# Patient Record
Sex: Male | Born: 1945
Health system: Southern US, Community
[De-identification: ages and names within clinical notes are randomized; demographics above are authoritative.]

## PROBLEM LIST (undated history)

## (undated) DIAGNOSIS — R21 Rash and other nonspecific skin eruption: Secondary | ICD-10-CM

## (undated) DIAGNOSIS — R030 Elevated blood-pressure reading, without diagnosis of hypertension: Secondary | ICD-10-CM

## (undated) HISTORY — PX: KNEE ARTHROSCOPY: SUR90

## (undated) HISTORY — PX: TONSILLECTOMY: SUR1361

---

## 2006-01-14 ENCOUNTER — Ambulatory Visit (HOSPITAL_COMMUNITY): Admission: RE | Admit: 2006-01-14 | Discharge: 2006-01-14 | Payer: Self-pay | Admitting: Surgery

## 2012-04-02 DIAGNOSIS — Z0000 Encounter for general adult medical examination without abnormal findings: Secondary | ICD-10-CM | POA: Diagnosis not present

## 2013-11-29 ENCOUNTER — Ambulatory Visit (HOSPITAL_COMMUNITY)
Admission: RE | Admit: 2013-11-29 | Discharge: 2013-11-29 | Disposition: A | Discharge status: Home / Self Care | Payer: Medicare Other | Source: Ambulatory Visit | Attending: Family Medicine | Admitting: Family Medicine

## 2013-11-29 ENCOUNTER — Ambulatory Visit (INDEPENDENT_AMBULATORY_CARE_PROVIDER_SITE_OTHER): Payer: Medicare Other | Admitting: Family Medicine

## 2013-11-29 ENCOUNTER — Encounter (INDEPENDENT_AMBULATORY_CARE_PROVIDER_SITE_OTHER): Payer: Self-pay

## 2013-11-29 ENCOUNTER — Encounter: Payer: Self-pay | Admitting: Family Medicine

## 2013-11-29 VITALS — BP 138/86 | HR 68 | Temp 98.5°F | Ht 74.0 in | Wt 168.0 lb

## 2013-11-29 DIAGNOSIS — K4090 Unilateral inguinal hernia, without obstruction or gangrene, not specified as recurrent: Secondary | ICD-10-CM | POA: Diagnosis not present

## 2013-11-29 DIAGNOSIS — R109 Unspecified abdominal pain: Secondary | ICD-10-CM | POA: Insufficient documentation

## 2013-11-29 DIAGNOSIS — K439 Ventral hernia without obstruction or gangrene: Secondary | ICD-10-CM | POA: Insufficient documentation

## 2013-11-29 DIAGNOSIS — F1010 Alcohol abuse, uncomplicated: Secondary | ICD-10-CM

## 2013-11-29 DIAGNOSIS — T148XXA Other injury of unspecified body region, initial encounter: Secondary | ICD-10-CM

## 2013-11-29 MED ORDER — IOHEXOL 300 MG/ML  SOLN
100.0000 mL | Freq: Once | INTRAMUSCULAR | Status: AC | PRN
  Administered 2013-11-29: 100 mL via INTRAVENOUS

## 2013-11-29 NOTE — Addendum Note (Signed)
Addended by: Orma Render F on: 11/29/2013 12:40 PM   Modules accepted: Orders

## 2013-11-29 NOTE — Progress Notes (Signed)
   Subjective:    Patient ID: Anthony Wise, male    DOB: 1946/08/26, 67 y.o.   MRN: [id number]  HPI Pt presents today with chief complaint of inguinal hernia Pt states that he was seen for inguinal hernia in 2009 Was supposed to have surgery, but was held because pt had active rash.  Had rash evaluated by derm, but never followed up for surgery.  Pt states that since 2009, hernia has progressively gotten larger.  No abd pain, feverm nausea, vomiting, diarrhea.  Denies any strenuous activity.   Pt also reports easy bruising over last year.  No overt bleeding Seems to bruise with slightest trauma.  No recent infections.  Pt does report drinking at least 6-8 beers per day.  No aspirin or anticoagulant use.  No NSAIDs.      Review of Systems  All other systems reviewed and are negative.       Objective:   Physical Exam  Constitutional: He appears well-developed and well-nourished.  HENT:  Head: Normocephalic and atraumatic.  Eyes: Conjunctivae are normal. Pupils are equal, round, and reactive to light.  Neck: Normal range of motion. Neck supple.  Cardiovascular: Normal rate and regular rhythm.   Pulmonary/Chest: Effort normal and breath sounds normal.  Abdominal: Soft.  Genitourinary:     Marked L inguinal hernia extending in L groin. Volume approx [phone] ccs Non tender    Neurological: He is alert.  Skin: Skin is warm.  Noted faint ecchymoses on dorsum of hands            Assessment & Plan:  Inguinal hernia - Plan: Comprehensive metabolic panel, CT Abdomen Pelvis W Contrast, CANCELED: CT Abdomen Pelvis W Contrast  Bruising - Plan: POCT CBC, POCT INR  ETOH abuse - Plan: Ammonia  Will send pt for CT scan to assess extent of hernia with pendin surgery follow up.  Also check baseline Cr and K Bruising:  Broad DDx, though higher concern for coagulopathy 2/2 liver disease in setting of longstanding ETOH.  Check INR, CBC.  Also check ammonia level.  If  thrombocytopenia present, check DIC panel.  Consider heme follow up.  Avoid NSAIDs, ASA.  Avoid ETOH Follow up pending bloodwork.

## 2013-11-30 LAB — CBC WITH DIFFERENTIAL
Basophils Absolute: 0.1 10*3/uL (ref 0.0–0.2)
Eosinophils Absolute: 0.3 10*3/uL (ref 0.0–0.4)
Immature Grans (Abs): 0 10*3/uL (ref 0.0–0.1)
Immature Granulocytes: 0 %
MCH: 32.8 pg (ref 26.6–33.0)
MCHC: 34.8 g/dL (ref 31.5–35.7)
Monocytes Absolute: 0.6 10*3/uL (ref 0.1–0.9)
RBC: 4.6 x10E6/uL (ref 4.14–5.80)
RDW: 13.2 % (ref 12.3–15.4)
WBC: 5.1 10*3/uL (ref 3.4–10.8)

## 2013-11-30 LAB — COMPREHENSIVE METABOLIC PANEL
Albumin/Globulin Ratio: 1.8 (ref 1.1–2.5)
Albumin: 4.4 g/dL (ref 3.6–4.8)
BUN/Creatinine Ratio: 15 (ref 10–22)
BUN: 8 mg/dL (ref 8–27)
CO2: 25 mmol/L (ref 18–29)
Calcium: 9.4 mg/dL (ref 8.6–10.2)
Potassium: 5 mmol/L (ref 3.5–5.2)
Sodium: 132 mmol/L — ABNORMAL LOW (ref 134–144)

## 2013-11-30 LAB — POCT I-STAT CREATININE: Creatinine, Ser: 0.7 mg/dL (ref 0.50–1.35)

## 2013-12-01 ENCOUNTER — Telehealth: Payer: Self-pay | Admitting: *Deleted

## 2013-12-01 NOTE — Telephone Encounter (Signed)
Left message for pt to call back. Labs normal

## 2013-12-01 NOTE — Telephone Encounter (Signed)
Pt notified of results Verbalizes understanding 

## 2014-01-14 ENCOUNTER — Encounter (INDEPENDENT_AMBULATORY_CARE_PROVIDER_SITE_OTHER): Payer: Self-pay | Admitting: Surgery

## 2014-01-14 ENCOUNTER — Ambulatory Visit (INDEPENDENT_AMBULATORY_CARE_PROVIDER_SITE_OTHER): Payer: Medicare Other | Admitting: Surgery

## 2014-01-14 VITALS — BP 150/98 | HR 76 | Temp 97.9°F | Resp 14 | Ht 75.0 in | Wt 170.0 lb

## 2014-01-14 DIAGNOSIS — K4090 Unilateral inguinal hernia, without obstruction or gangrene, not specified as recurrent: Secondary | ICD-10-CM

## 2014-01-14 NOTE — Progress Notes (Signed)
Re:   Anthony Wise. DOB:   May 12, 1946 MRN:   [id number]  ASSESSMENT AND PLAN: 1.  Giant incarcerated left inguinal hernia  I discussed the indications and complications of hernia surgery with the patient.  I discussed both the laparoscopic and open approach to hernia repair..  The potential risks of hernia surgery include, but are not limited to, bleeding, infection, open surgery, nerve injury, and recurrence of the hernia.  I provided the patient literature about hernia surgery.  Because of the size of the hernia, he is not a candidate for laparoscopic repair and needs an open repair.  I also discussed with him possible laparotomy/laparoscopy to reduce the hernia, if I could not manage it by an inguinal incision.  2.  Smokes - has cut back to 1/2 ppd 3.  Back issues.  Has seen a chiropractor remotely.  Chief Complaint  Patient presents with  . New Evaluation    eval [facility]   REFERRING PHYSICIAN: Redge Gainer, MD  HISTORY OF PRESENT ILLNESS: Anthony Blyth. is a 68 y.o. (DOB: March 28, 1946)  White  male whose primary care physician is Dr. Ernestina Wise (with  Redge Gainer, MD) and comes to me today for a left inguinal hernia. He comes with his wife. He has had a left inguinal hernia since at least 2009.  It has gotten steadily larger, but he decided to do nothing about it.  But recently he said that is has gotten "out of control".  He saw Dr. Ernestina Wise, who ordered a CT scan and referred the patient for further evaluation.  CT scan - 11/29/2014 - 1. There is a large left inguinal scrotal ventral hernia containing fat small bowel and large bowel without evidence of acute obstruction or incarceration. Mild stranding of the fat surrounding central mesenteric vessels suspicious for mild edema. The hernia is only partially visualized measures at least 7.5 by 10 cm  2. Nonspecific mild thickening of urinary bladder wall.  3. Extensive atherosclerotic vascular calcifications.  4. No hydronephrosis  or hydroureter.  5. No pericecal inflammation.    History reviewed. No pertinent past medical history.    Past Surgical History  Procedure Laterality Date  . Knee arthroscopy        No current outpatient prescriptions on file.   No current facility-administered medications for this visit.     No Known Allergies  REVIEW OF SYSTEMS: Skin:  No history of rash.  No history of abnormal moles. Infection:  No history of hepatitis or HIV.  No history of MRSA. Neurologic:  No history of stroke.  No history of seizure.  No history of headaches. Cardiac:  No history of hypertension. No history of heart disease.  No history of prior cardiac catheterization.  No history of seeing a cardiologist. Pulmonary: Smokes 1/2 ppd.  He said that he has cut down from about 2 ppd.  Endocrine:  No diabetes. No thyroid disease. Gastrointestinal:  No history of stomach disease.  No history of liver disease.  No history of gall bladder disease.  No history of pancreas disease.  No history of colon disease. Urologic:  No history of kidney stones.  No history of bladder infections. Musculoskeletal:  Has had some back pain, seen by a chiropractor in the past.  Had Baker's cyst removed from right knee about 1990 Hematologic:  No bleeding disorder.  No history of anemia.  Not anticoagulated. Psycho-social:  The patient is oriented.   The patient has no obvious psychologic or social  impairment to understanding our conversation and plan.  SOCIAL and FAMILY HISTORY: Married - wife Anthony Wise with him. 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  PHYSICAL EXAM: BP 150/98  Pulse 76  Temp(Src) 97.9 F (36.6 C) (Temporal)  Resp 14  Ht 6\' 3"  (1.905 m)  Wt 170 lb (77.111 kg)  BMI 21.25 kg/m2  General: Thin WN WM who is alert and generally healthy appearing.  HEENT: Normal. Pupils equal. Neck: Supple. No mass.  No thyroid mass. Lymph Nodes:  No supraclavicular or cervical nodes. Lungs: Clear  to auscultation and symmetric breath sounds. Heart:  RRR. No murmur or rub. Abdomen: Soft. No mass. No tenderness.Normal bowel sounds.  He has a very large, non reducible left inguinal hernia.  It is non tender and chronically incarcerated. Rectal: Not done. Extremities:  Good strength and ROM  in upper and lower extremities. Neurologic:  Grossly intact to motor and sensory function. Psychiatric: Has normal mood and affect. Behavior is normal.   DATA REVIEWED: Notes in [place], MD,  [facility], [place] [address] [place]  [place], [place]    [place] Phone:  [place]:  [phone]

## 2014-01-19 ENCOUNTER — Encounter (HOSPITAL_COMMUNITY): Payer: Self-pay | Admitting: Pharmacy Technician

## 2014-01-20 ENCOUNTER — Encounter (HOSPITAL_COMMUNITY): Payer: Self-pay

## 2014-01-20 ENCOUNTER — Ambulatory Visit (HOSPITAL_COMMUNITY)
Admission: RE | Admit: 2014-01-20 | Discharge: 2014-01-20 | Disposition: A | Discharge status: Home / Self Care | Payer: Medicare Other | Source: Ambulatory Visit | Attending: Surgery | Admitting: Surgery

## 2014-01-20 ENCOUNTER — Encounter (HOSPITAL_COMMUNITY)
Admission: RE | Admit: 2014-01-20 | Discharge: 2014-01-20 | Disposition: A | Discharge status: Home / Self Care | Payer: Medicare Other | Source: Ambulatory Visit | Attending: Surgery | Admitting: Surgery

## 2014-01-20 DIAGNOSIS — Z01810 Encounter for preprocedural cardiovascular examination: Secondary | ICD-10-CM

## 2014-01-20 DIAGNOSIS — Z01818 Encounter for other preprocedural examination: Secondary | ICD-10-CM

## 2014-01-20 DIAGNOSIS — Z01812 Encounter for preprocedural laboratory examination: Secondary | ICD-10-CM

## 2014-01-20 DIAGNOSIS — Z23 Encounter for immunization: Secondary | ICD-10-CM | POA: Diagnosis not present

## 2014-01-20 DIAGNOSIS — IMO0001 Reserved for inherently not codable concepts without codable children: Secondary | ICD-10-CM

## 2014-01-20 DIAGNOSIS — K4030 Unilateral inguinal hernia, with obstruction, without gangrene, not specified as recurrent: Secondary | ICD-10-CM | POA: Diagnosis not present

## 2014-01-20 DIAGNOSIS — K4090 Unilateral inguinal hernia, without obstruction or gangrene, not specified as recurrent: Secondary | ICD-10-CM | POA: Insufficient documentation

## 2014-01-20 DIAGNOSIS — F17200 Nicotine dependence, unspecified, uncomplicated: Secondary | ICD-10-CM | POA: Diagnosis not present

## 2014-01-20 DIAGNOSIS — I7090 Unspecified atherosclerosis: Secondary | ICD-10-CM | POA: Diagnosis not present

## 2014-01-20 HISTORY — DX: Reserved for inherently not codable concepts without codable children: IMO0001

## 2014-01-20 HISTORY — DX: Elevated blood-pressure reading, without diagnosis of hypertension: R03.0

## 2014-01-20 LAB — CBC
HEMATOCRIT: 41.4 % (ref 39.0–52.0)
Hemoglobin: 14.5 g/dL (ref 13.0–17.0)
MCH: 31.7 pg (ref 26.0–34.0)
MCHC: 35 g/dL (ref 30.0–36.0)
MCV: 90.6 fL (ref 78.0–100.0)
Platelets: 230 10*3/uL (ref 150–400)
RBC: 4.57 MIL/uL (ref 4.22–5.81)
RDW: 12.9 % (ref 11.5–15.5)
WBC: 5.5 10*3/uL (ref 4.0–10.5)

## 2014-01-20 LAB — BASIC METABOLIC PANEL
BUN: 8 mg/dL (ref 6–23)
CALCIUM: 9.1 mg/dL (ref 8.4–10.5)
CO2: 24 meq/L (ref 19–32)
Chloride: 96 mEq/L (ref 96–112)
Creatinine, Ser: 0.5 mg/dL (ref 0.50–1.35)
GFR calc Af Amer: >90 mL/min (ref >90)
GLUCOSE: 94 mg/dL (ref 70–99)
Potassium: 4.2 mEq/L (ref 3.7–5.3)
SODIUM: 131 meq/L — AB (ref 137–147)

## 2014-01-20 NOTE — Pre-Procedure Instructions (Signed)
3:45 PM- CHART SENT TO SHORT STAY CENTER - CXR REPORT IS PENDING

## 2014-01-20 NOTE — Patient Instructions (Signed)
YOUR SURGERY IS SCHEDULED AT [facility]  ON:  Friday  1/23  REPORT TO  SHORT STAY CENTER AT:  8:00 AM      PHONE # FOR SHORT STAY IS [phone]  DO NOT EAT OR DRINK ANYTHING AFTER MIDNIGHT THE NIGHT BEFORE YOUR SURGERY.  YOU MAY BRUSH YOUR TEETH, RINSE OUT YOUR MOUTH--BUT NO WATER, NO FOOD, NO CHEWING GUM, NO MINTS, NO CANDIES, NO CHEWING TOBACCO.  PLEASE TAKE THE FOLLOWING MEDICATIONS THE AM OF YOUR SURGERY WITH A FEW SIPS OF WATER:  NO MEDS TO TAKE   DO NOT BRING VALUABLES, MONEY, CREDIT CARDS.  DO NOT WEAR JEWELRY, MAKE-UP, NAIL POLISH AND NO METAL PINS OR CLIPS IN YOUR HAIR. CONTACT LENS, DENTURES / PARTIALS, GLASSES SHOULD NOT BE WORN TO SURGERY AND IN MOST CASES-HEARING AIDS WILL NEED TO BE REMOVED.  BRING YOUR GLASSES CASE, ANY EQUIPMENT NEEDED FOR YOUR CONTACT LENS. FOR PATIENTS ADMITTED TO THE HOSPITAL--CHECK OUT TIME THE DAY OF DISCHARGE IS 11:00 AM.  ALL INPATIENT ROOMS ARE PRIVATE - WITH BATHROOM, TELEPHONE, TELEVISION AND WIFI INTERNET.                                                    FAILURE TO FOLLOW THESE INSTRUCTIONS MAY RESULT IN THE CANCELLATION OF YOUR SURGERY. PLEASE BE AWARE THAT YOU MAY NEED ADDITIONAL BLOOD DRAWN DAY OF YOUR SURGERY  PATIENT SIGNATURE_________________________________

## 2014-01-20 NOTE — Pre-Procedure Instructions (Signed)
EKG AND CXR WERE DONE TODAY - PREOP AT [facility]. 

## 2014-01-21 ENCOUNTER — Encounter (HOSPITAL_COMMUNITY)
Admission: RE | Disposition: A | Discharge status: Home / Self Care | Payer: Self-pay | Source: Ambulatory Visit | Attending: Surgery

## 2014-01-21 ENCOUNTER — Encounter (HOSPITAL_COMMUNITY): Payer: Medicare Other | Admitting: *Deleted

## 2014-01-21 ENCOUNTER — Encounter (HOSPITAL_COMMUNITY): Payer: Self-pay | Admitting: *Deleted

## 2014-01-21 ENCOUNTER — Observation Stay (HOSPITAL_COMMUNITY)
Admission: RE | Admit: 2014-01-21 | Discharge: 2014-01-22 | Disposition: A | Discharge status: Home / Self Care | Payer: Medicare Other | Source: Ambulatory Visit | Attending: Surgery | Admitting: Surgery

## 2014-01-21 ENCOUNTER — Ambulatory Visit (HOSPITAL_COMMUNITY): Payer: Medicare Other | Admitting: *Deleted

## 2014-01-21 DIAGNOSIS — F17200 Nicotine dependence, unspecified, uncomplicated: Secondary | ICD-10-CM | POA: Diagnosis not present

## 2014-01-21 DIAGNOSIS — Z23 Encounter for immunization: Secondary | ICD-10-CM | POA: Insufficient documentation

## 2014-01-21 DIAGNOSIS — I7090 Unspecified atherosclerosis: Secondary | ICD-10-CM | POA: Insufficient documentation

## 2014-01-21 DIAGNOSIS — K4030 Unilateral inguinal hernia, with obstruction, without gangrene, not specified as recurrent: Secondary | ICD-10-CM

## 2014-01-21 DIAGNOSIS — K4090 Unilateral inguinal hernia, without obstruction or gangrene, not specified as recurrent: Secondary | ICD-10-CM | POA: Diagnosis not present

## 2014-01-21 HISTORY — PX: INGUINAL HERNIA REPAIR: SHX194

## 2014-01-21 SURGERY — REPAIR, HERNIA, INGUINAL, ADULT
Anesthesia: General | Site: Abdomen | Laterality: Left

## 2014-01-21 MED ORDER — BUPIVACAINE HCL (PF) 0.25 % IJ SOLN: INTRAMUSCULAR | Status: DC | PRN

## 2014-01-21 MED ORDER — ROCURONIUM BROMIDE 100 MG/10ML IV SOLN
INTRAVENOUS | Status: DC | PRN
  Administered 2014-01-21: 10 mg via INTRAVENOUS
  Administered 2014-01-21: 40 mg via INTRAVENOUS

## 2014-01-21 MED ORDER — CEFAZOLIN SODIUM-DEXTROSE 2-3 GM-% IV SOLR
INTRAVENOUS | Status: AC
  Filled 2014-01-21: qty 50

## 2014-01-21 MED ORDER — INFLUENZA VAC SPLIT QUAD 0.5 ML IM SUSP
0.5000 mL | INTRAMUSCULAR | Status: AC
  Administered 2014-01-22: 0.5 mL via INTRAMUSCULAR
  Filled 2014-01-21 (×2): qty 0.5

## 2014-01-21 MED ORDER — MIDAZOLAM HCL 5 MG/5ML IJ SOLN
INTRAMUSCULAR | Status: DC | PRN
  Administered 2014-01-21 (×2): 1 mg via INTRAVENOUS

## 2014-01-21 MED ORDER — HYDROCODONE-ACETAMINOPHEN 5-325 MG PO TABS: 1.0000 | ORAL_TABLET | ORAL | Status: DC | PRN

## 2014-01-21 MED ORDER — CEFAZOLIN SODIUM-DEXTROSE 2-3 GM-% IV SOLR
2.0000 g | INTRAVENOUS | Status: AC
  Administered 2014-01-21: 2 g via INTRAVENOUS

## 2014-01-21 MED ORDER — CHLORHEXIDINE GLUCONATE 4 % EX LIQD: 1.0000 "application " | Freq: Once | CUTANEOUS | Status: DC

## 2014-01-21 MED ORDER — MORPHINE SULFATE 2 MG/ML IJ SOLN: 1.0000 mg | INTRAMUSCULAR | Status: DC | PRN

## 2014-01-21 MED ORDER — FENTANYL CITRATE 0.05 MG/ML IJ SOLN
INTRAMUSCULAR | Status: AC
  Filled 2014-01-21: qty 5

## 2014-01-21 MED ORDER — PROPOFOL 10 MG/ML IV BOLUS
INTRAVENOUS | Status: AC
  Filled 2014-01-21: qty 20

## 2014-01-21 MED ORDER — HYDROMORPHONE HCL PF 1 MG/ML IJ SOLN: 0.2500 mg | INTRAMUSCULAR | Status: DC | PRN

## 2014-01-21 MED ORDER — MIDAZOLAM HCL 2 MG/2ML IJ SOLN
INTRAMUSCULAR | Status: AC
  Filled 2014-01-21: qty 2

## 2014-01-21 MED ORDER — LACTATED RINGERS IV SOLN: INTRAVENOUS | Status: DC

## 2014-01-21 MED ORDER — NEOSTIGMINE METHYLSULFATE 1 MG/ML IJ SOLN
INTRAMUSCULAR | Status: DC | PRN
  Administered 2014-01-21 (×4): 1 mg via INTRAVENOUS

## 2014-01-21 MED ORDER — DEXAMETHASONE SODIUM PHOSPHATE 10 MG/ML IJ SOLN
INTRAMUSCULAR | Status: AC
  Filled 2014-01-21: qty 1

## 2014-01-21 MED ORDER — LACTATED RINGERS IV SOLN: INTRAVENOUS | Status: DC | PRN

## 2014-01-21 MED ORDER — BUPIVACAINE HCL (PF) 0.25 % IJ SOLN
INTRAMUSCULAR | Status: AC
  Filled 2014-01-21: qty 30

## 2014-01-21 MED ORDER — NEOSTIGMINE METHYLSULFATE 1 MG/ML IJ SOLN
INTRAMUSCULAR | Status: AC
  Filled 2014-01-21: qty 10

## 2014-01-21 MED ORDER — PROPOFOL 10 MG/ML IV BOLUS
INTRAVENOUS | Status: DC | PRN
  Administered 2014-01-21: 50 mg via INTRAVENOUS
  Administered 2014-01-21: 150 mg via INTRAVENOUS

## 2014-01-21 MED ORDER — FENTANYL CITRATE 0.05 MG/ML IJ SOLN
INTRAMUSCULAR | Status: DC | PRN
  Administered 2014-01-21: 100 ug via INTRAVENOUS
  Administered 2014-01-21 (×2): 50 ug via INTRAVENOUS

## 2014-01-21 MED ORDER — HEPARIN SODIUM (PORCINE) 5000 UNIT/ML IJ SOLN
5000.0000 [IU] | Freq: Three times a day (TID) | INTRAMUSCULAR | Status: DC
Start: 2014-01-21 — End: 2014-01-22
  Administered 2014-01-21 – 2014-01-22 (×2): 5000 [IU] via SUBCUTANEOUS
  Filled 2014-01-21 (×5): qty 1

## 2014-01-21 MED ORDER — PNEUMOCOCCAL VAC POLYVALENT 25 MCG/0.5ML IJ INJ
0.5000 mL | INJECTION | INTRAMUSCULAR | Status: AC
  Administered 2014-01-22: 0.5 mL via INTRAMUSCULAR
  Filled 2014-01-21 (×2): qty 0.5

## 2014-01-21 MED ORDER — KETAMINE HCL 10 MG/ML IJ SOLN
INTRAMUSCULAR | Status: DC | PRN
  Administered 2014-01-21: 20 mg via INTRAVENOUS
  Administered 2014-01-21: 10 mg via INTRAVENOUS
  Administered 2014-01-21: 20 mg via INTRAVENOUS

## 2014-01-21 MED ORDER — IBUPROFEN 600 MG PO TABS
600.0000 mg | ORAL_TABLET | Freq: Four times a day (QID) | ORAL | Status: DC | PRN
  Filled 2014-01-21: qty 1

## 2014-01-21 MED ORDER — ONDANSETRON HCL 4 MG/2ML IJ SOLN
INTRAMUSCULAR | Status: DC | PRN
  Administered 2014-01-21: 4 mg via INTRAVENOUS

## 2014-01-21 MED ORDER — EPHEDRINE SULFATE 50 MG/ML IJ SOLN
INTRAMUSCULAR | Status: DC | PRN
  Administered 2014-01-21 (×2): 10 mg via INTRAVENOUS
  Administered 2014-01-21: 5 mg via INTRAVENOUS
  Administered 2014-01-21 (×2): 10 mg via INTRAVENOUS

## 2014-01-21 MED ORDER — METOCLOPRAMIDE HCL 5 MG/ML IJ SOLN
INTRAMUSCULAR | Status: AC
  Filled 2014-01-21: qty 2

## 2014-01-21 MED ORDER — POTASSIUM CHLORIDE IN NACL 20-0.45 MEQ/L-% IV SOLN
INTRAVENOUS | Status: DC
  Administered 2014-01-21: 15:00:00 via INTRAVENOUS
  Filled 2014-01-21 (×2): qty 1000

## 2014-01-21 MED ORDER — 0.9 % SODIUM CHLORIDE (POUR BTL) OPTIME
TOPICAL | Status: DC | PRN
  Administered 2014-01-21: 1000 mL

## 2014-01-21 MED ORDER — PHENYLEPHRINE 40 MCG/ML (10ML) SYRINGE FOR IV PUSH (FOR BLOOD PRESSURE SUPPORT)
PREFILLED_SYRINGE | INTRAVENOUS | Status: AC
  Filled 2014-01-21: qty 10

## 2014-01-21 MED ORDER — PHENYLEPHRINE HCL 10 MG/ML IJ SOLN
INTRAMUSCULAR | Status: AC
  Filled 2014-01-21: qty 1

## 2014-01-21 MED ORDER — GLYCOPYRROLATE 0.2 MG/ML IJ SOLN
INTRAMUSCULAR | Status: AC
  Filled 2014-01-21: qty 3

## 2014-01-21 MED ORDER — EPHEDRINE SULFATE 50 MG/ML IJ SOLN
INTRAMUSCULAR | Status: AC
  Filled 2014-01-21: qty 1

## 2014-01-21 MED ORDER — SODIUM CHLORIDE 0.9 % IJ SOLN
INTRAMUSCULAR | Status: AC
  Filled 2014-01-21: qty 50

## 2014-01-21 MED ORDER — SODIUM CHLORIDE 0.9 % IJ SOLN
INTRAMUSCULAR | Status: DC | PRN
  Administered 2014-01-21: 12:00:00

## 2014-01-21 MED ORDER — KETAMINE HCL 10 MG/ML IJ SOLN
INTRAMUSCULAR | Status: AC
  Filled 2014-01-21: qty 1

## 2014-01-21 MED ORDER — LACTATED RINGERS IV SOLN
INTRAVENOUS | Status: DC | PRN
  Administered 2014-01-21 (×3): via INTRAVENOUS

## 2014-01-21 MED ORDER — DEXAMETHASONE SODIUM PHOSPHATE 4 MG/ML IJ SOLN
INTRAMUSCULAR | Status: DC | PRN
  Administered 2014-01-21: 10 mg via INTRAVENOUS

## 2014-01-21 MED ORDER — PROMETHAZINE HCL 25 MG/ML IJ SOLN: 6.2500 mg | INTRAMUSCULAR | Status: DC | PRN

## 2014-01-21 MED ORDER — PHENYLEPHRINE HCL 10 MG/ML IJ SOLN
INTRAMUSCULAR | Status: DC | PRN
  Administered 2014-01-21: 40 ug via INTRAVENOUS
  Administered 2014-01-21: 80 ug via INTRAVENOUS
  Administered 2014-01-21: 120 ug via INTRAVENOUS
  Administered 2014-01-21: 40 ug via INTRAVENOUS
  Administered 2014-01-21: 120 ug via INTRAVENOUS

## 2014-01-21 MED ORDER — BUPIVACAINE LIPOSOME 1.3 % IJ SUSP
20.0000 mL | Freq: Once | INTRAMUSCULAR | Status: DC
  Filled 2014-01-21: qty 20

## 2014-01-21 MED ORDER — ONDANSETRON HCL 4 MG/2ML IJ SOLN
INTRAMUSCULAR | Status: AC
  Filled 2014-01-21: qty 2

## 2014-01-21 MED ORDER — ROCURONIUM BROMIDE 100 MG/10ML IV SOLN
INTRAVENOUS | Status: AC
  Filled 2014-01-21: qty 1

## 2014-01-21 MED ORDER — GLYCOPYRROLATE 0.2 MG/ML IJ SOLN
INTRAMUSCULAR | Status: DC | PRN
  Administered 2014-01-21 (×4): 0.1 mg via INTRAVENOUS

## 2014-01-21 MED ORDER — METOCLOPRAMIDE HCL 5 MG/ML IJ SOLN
INTRAMUSCULAR | Status: DC | PRN
  Administered 2014-01-21: 10 mg via INTRAVENOUS

## 2014-01-21 SURGICAL SUPPLY — 46 items
ADH SKN CLS APL DERMABOND .7 (GAUZE/BANDAGES/DRESSINGS) ×1
APL SKNCLS STERI-STRIP NONHPOA (GAUZE/BANDAGES/DRESSINGS)
BENZOIN TINCTURE PRP APPL 2/3 (GAUZE/BANDAGES/DRESSINGS) ×1 IMPLANT
BLADE HEX COATED 2.75 (ELECTRODE) ×3 IMPLANT
BLADE SURG 15 STRL LF DISP TIS (BLADE) IMPLANT
BLADE SURG 15 STRL SS (BLADE) ×3
BLADE SURG SZ10 CARB STEEL (BLADE) ×4 IMPLANT
CANISTER SUCTION 2500CC (MISCELLANEOUS) ×1 IMPLANT
CHLORAPREP W/TINT 26ML (MISCELLANEOUS) ×2 IMPLANT
CLOSURE WOUND 1/2 X4 (GAUZE/BANDAGES/DRESSINGS)
DECANTER SPIKE VIAL GLASS SM (MISCELLANEOUS) ×3 IMPLANT
DERMABOND ADVANCED (GAUZE/BANDAGES/DRESSINGS) ×2
DERMABOND ADVANCED .7 DNX12 (GAUZE/BANDAGES/DRESSINGS) IMPLANT
DISSECTOR ROUND CHERRY 3/8 STR (MISCELLANEOUS) ×2 IMPLANT
DRAIN PENROSE 18X1/2 LTX STRL (DRAIN) ×3 IMPLANT
DRAPE LAPAROTOMY TRNSV 102X78 (DRAPE) ×3 IMPLANT
ELECT REM PT RETURN 9FT ADLT (ELECTROSURGICAL) ×3
ELECTRODE REM PT RTRN 9FT ADLT (ELECTROSURGICAL) ×1 IMPLANT
GLOVE BIOGEL PI IND STRL 7.0 (GLOVE) ×1 IMPLANT
GLOVE BIOGEL PI INDICATOR 7.0 (GLOVE) ×4
GLOVE SURG SIGNA 7.5 PF LTX (GLOVE) ×5 IMPLANT
GOWN STRL REUS W/TWL LRG LVL3 (GOWN DISPOSABLE) ×3 IMPLANT
GOWN STRL REUS W/TWL XL LVL3 (GOWN DISPOSABLE) ×8 IMPLANT
KIT BASIN OR (CUSTOM PROCEDURE TRAY) ×3 IMPLANT
MESH ULTRAPRO 3X6 7.6X15CM (Mesh General) ×2 IMPLANT
NDL HYPO 25X1 1.5 SAFETY (NEEDLE) ×1 IMPLANT
NEEDLE HYPO 25X1 1.5 SAFETY (NEEDLE) ×3 IMPLANT
NS IRRIG 1000ML POUR BTL (IV SOLUTION) ×3 IMPLANT
PACK BASIC VI WITH GOWN DISP (CUSTOM PROCEDURE TRAY) ×3 IMPLANT
PENCIL BUTTON HOLSTER BLD 10FT (ELECTRODE) ×3 IMPLANT
SPONGE GAUZE 4X4 12PLY (GAUZE/BANDAGES/DRESSINGS) ×3 IMPLANT
SPONGE LAP 18X18 X RAY DECT (DISPOSABLE) ×5 IMPLANT
SPONGE LAP 4X18 X RAY DECT (DISPOSABLE) IMPLANT
STRIP CLOSURE SKIN 1/2X4 (GAUZE/BANDAGES/DRESSINGS) ×1 IMPLANT
SUT CHROMIC 0 SH (SUTURE) IMPLANT
SUT MON AB 5-0 PS2 18 (SUTURE) ×2 IMPLANT
SUT NOVA 0 T19/GS 22DT (SUTURE) ×8 IMPLANT
SUT VIC AB 0 CT2 27 (SUTURE) ×3 IMPLANT
SUT VIC AB 2-0 SH 27 (SUTURE) ×3
SUT VIC AB 2-0 SH 27X BRD (SUTURE) ×1 IMPLANT
SUT VIC AB 3-0 SH 18 (SUTURE) ×3 IMPLANT
SYR BULB IRRIGATION 50ML (SYRINGE) ×3 IMPLANT
SYR CONTROL 10ML LL (SYRINGE) ×5 IMPLANT
TOWEL OR 17X26 10 PK STRL BLUE (TOWEL DISPOSABLE) ×3 IMPLANT
TOWEL OR NON WOVEN STRL DISP B (DISPOSABLE) ×2 IMPLANT
YANKAUER SUCT BULB TIP 10FT TU (MISCELLANEOUS) ×3 IMPLANT

## 2014-01-21 NOTE — Discharge Instructions (Signed)
[facility] - DISCHARGE INSTRUCTIONS TO PATIENT  Activity:  Driving - May drive in 2 or 3 days, if doing well.   Lifting - No lifting > 15 pounds for 1 month.  Wound Care:   May shower starting Sunday, 1/25.  Diet:  As tolerated  Follow up appointment:  Call Dr. Pollie Friar office [facility]) at [phone] for an appointment in 2 to 3 weeks.  Medications and dosages:  Resume your home medications.  You have a prescription for:  Vicodin (I gave the prescription to your wife of Friday)  Call Dr. Lucia Gaskins or his office  [phone]) if you have:  Temperature greater than 100.4,  Persistent nausea and vomiting,  Severe uncontrolled pain,  Redness, tenderness, or signs of infection (pain, swelling, redness, odor or green/yellow discharge around the site),  Difficulty breathing, headache or visual disturbances,  Any other questions or concerns you may have after discharge.  In an emergency, call 911 or go to an Emergency Department at a nearby hospital.

## 2014-01-21 NOTE — Preoperative (Signed)
Beta Blockers   Reason not to administer Beta Blockers:Not Applicable 

## 2014-01-21 NOTE — Transfer of Care (Signed)
Immediate Anesthesia Transfer of Care Note  Patient: Anthony Wise.  Procedure(s) Performed: Procedure(s): OPEN LEFT INGUNIAL HERNIA REPAIR (Left)  Patient Location: PACU  Anesthesia Type:General  Level of Consciousness: Patient easily awoken, sedated, comfortable, cooperative, following commands, responds to stimulation.   Airway & Oxygen Therapy: Patient spontaneously breathing, ventilating well, oxygen via simple oxygen mask.  Post-op Assessment: Report given to PACU RN, vital signs reviewed and stable, moving all extremities.   Post vital signs: Reviewed and stable.  Complications: No apparent anesthesia complications

## 2014-01-21 NOTE — H&P (View-Only) (Signed)
 Re:   Anthony M Broda Jr. DOB:   09/21/1946 MRN:   [id number]  ASSESSMENT AND PLAN: 1.  Giant incarcerated left inguinal hernia  I discussed the indications and complications of hernia surgery with the patient.  I discussed both the laparoscopic and open approach to hernia repair..  The potential risks of hernia surgery include, but are not limited to, bleeding, infection, open surgery, nerve injury, and recurrence of the hernia.  I provided the patient literature about hernia surgery.  Because of the size of the hernia, he is not a candidate for laparoscopic repair and needs an open repair.  I also discussed with him possible laparotomy/laparoscopy to reduce the hernia, if I could not manage it by an inguinal incision.  2.  Smokes - has cut back to 1/2 ppd 3.  Back issues.  Has seen a chiropractor remotely.  Chief Complaint  Patient presents with  . New Evaluation    eval [facility]   REFERRING PHYSICIAN: MOORE, DONALD, MD  HISTORY OF PRESENT ILLNESS: Anthony M Anthony Jr. is a 67 y.o. (DOB: 04/04/1946)  White  male whose primary care physician is Dr. Newton (with  MOORE, DONALD, MD) and comes to me today for a left inguinal hernia. He comes with his wife. He has had a left inguinal hernia since at least 2009.  It has gotten steadily larger, but he decided to do nothing about it.  But recently he said that is has gotten "out of control".  He saw Dr. Newton, who ordered a CT scan and referred the patient for further evaluation.  CT scan - 11/29/2014 - 1. There is a large left inguinal scrotal ventral hernia containing fat small bowel and large bowel without evidence of acute obstruction or incarceration. Mild stranding of the fat surrounding central mesenteric vessels suspicious for mild edema. The hernia is only partially visualized measures at least 7.5 by 10 cm  2. Nonspecific mild thickening of urinary bladder wall.  3. Extensive atherosclerotic vascular calcifications.  4. No hydronephrosis  or hydroureter.  5. No pericecal inflammation.    History reviewed. No pertinent past medical history.    Past Surgical History  Procedure Laterality Date  . Knee arthroscopy        No current outpatient prescriptions on file.   No current facility-administered medications for this visit.     No Known Allergies  REVIEW OF SYSTEMS: Skin:  No history of rash.  No history of abnormal moles. Infection:  No history of hepatitis or HIV.  No history of MRSA. Neurologic:  No history of stroke.  No history of seizure.  No history of headaches. Cardiac:  No history of hypertension. No history of heart disease.  No history of prior cardiac catheterization.  No history of seeing a cardiologist. Pulmonary: Smokes 1/2 ppd.  He said that he has cut down from about 2 ppd.  Endocrine:  No diabetes. No thyroid disease. Gastrointestinal:  No history of stomach disease.  No history of liver disease.  No history of gall bladder disease.  No history of pancreas disease.  No history of colon disease. Urologic:  No history of kidney stones.  No history of bladder infections. Musculoskeletal:  Has had some back pain, seen by a chiropractor in the past.  Had Baker's cyst removed from right knee about 1990 Hematologic:  No bleeding disorder.  No history of anemia.  Not anticoagulated. Psycho-social:  The patient is oriented.   The patient has no obvious psychologic or social   impairment to understanding our conversation and plan.  SOCIAL and FAMILY HISTORY: Married - wife Dorian Pod with him. 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  PHYSICAL EXAM: BP 150/98  Pulse 76  Temp(Src) 97.9 F (36.6 C) (Temporal)  Resp 14  Ht 6\' 3"  (1.905 m)  Wt 170 lb (77.111 kg)  BMI 21.25 kg/m2  General: Thin WN WM who is alert and generally healthy appearing.  HEENT: Normal. Pupils equal. Neck: Supple. No mass.  No thyroid mass. Lymph Nodes:  No supraclavicular or cervical nodes. Lungs: Clear  to auscultation and symmetric breath sounds. Heart:  RRR. No murmur or rub. Abdomen: Soft. No mass. No tenderness.Normal bowel sounds.  He has a very large, non reducible left inguinal hernia.  It is non tender and chronically incarcerated. Rectal: Not done. Extremities:  Good strength and ROM  in upper and lower extremities. Neurologic:  Grossly intact to motor and sensory function. Psychiatric: Has normal mood and affect. Behavior is normal.   DATA REVIEWED: Notes in [place], MD,  [facility], [place] [address]  [place], [place]    [place] Phone:  [place]:  [phone]

## 2014-01-21 NOTE — Anesthesia Postprocedure Evaluation (Signed)
Anesthesia Post Note  Patient: Anthony Wise.  Procedure(s) Performed: Procedure(s) (LRB): OPEN LEFT INGUNIAL HERNIA REPAIR (Left)  Anesthesia type: General  Patient location: PACU  Post pain: Pain level controlled  Post assessment: Post-op Vital signs reviewed  Last Vitals:  Filed Vitals:   01/21/14 1510  BP: 145/58  Pulse: 83  Temp: 36.4 C  Resp: 18    Post vital signs: Reviewed  Level of consciousness: sedated  Complications: No apparent anesthesia complications

## 2014-01-21 NOTE — Op Note (Signed)
01/21/2014  4:17 PM  PATIENT:  Anthony Wise., 68 y.o., male, MRN: [id number]  PREOP DIAGNOSIS:  large inguinal hernia [photo at end of note]  POSTOP DIAGNOSIS:   Large incarcerated left indirect sliding hernia  PROCEDURE:   Procedure(s): OPEN LEFT INGUNIAL HERNIA REPAIR  SURGEON:   Alphonsa Overall, M.D.  ANESTHESIA:   general  Anesthesiologist: Ayesha Mohair, MD CRNA: Victoriano Lain, CRNA; Heide Scales, CRNA  General  EBL:  minimal  ml  LOCAL MEDICATIONS USED:   20 cc Exparel (mixed with 10 cc saline)  SPECIMEN:   Hernia sace  COUNTS CORRECT:  YES  INDICATIONS FOR PROCEDURE:  Anthony Wise. is a 68 y.o. (DOB: Jan 31, 1946) white  male whose primary care physician is Anthony Gainer, MD and comes for a repair of a large left inguinal hernia which is chronically incarcerated.   The indications and risks of the hernia surgery were explained to the patient.  The risks include, but are not limited to, infection, bleeding, recurrence of the hernia, and nerve injury. Because of the size of the hernia, I was not sure that I could reduce it, and I discussed both laparoscopic and open approaches to repairing the hernia.  Operative Note: The patient was taken to room number 11 at [facility].  He underwent a general anesthesia.    A time out was held and the surgical checklist run.  His abdomen was shaved and then prepped with chloroprep.  Betadine was used around his scrotum.  Because of the size of the hernia, I placed a foley.   With him fully relaxed with general anesthesia, I was able to fully reduce the hernia.  A left inguinal incision was made through the subcutaneous fat to the external oblique fascia.  The external ring was opened and the cord structures encircled with a penrose drain.  The patient had a large indirect inguinal hernia and much of the inguinal floor was destroyed because of the size of the hernia.  The external ring was opened and the cord structures  encircled with a penrose drain.  The patient had an indirect left inguinal hernia. The patient had a hernia sac the came along the anterior medial portion of the cord structures.  The sac was dissected free of the cord structures.  The sac was opened and went to the peritoneal cavity.  The sigmoid colon made up the inferior edge of the sac, so this is a sliding inguinal hernia.  There was no palpable mass within the peritoneal cavity.  The sac was partially resected and was ligated with a 2-0 Vicryl suture.  The inguinal floor was repaired with a 3 x 6 inch piece of Ultrapro mesh.  The mesh was cut to fit the inguinal floor.  The mesh was sewn in place with interrupted 0 Novafil suture.  A key hole was made for the internal ring.  The mesh lay flat.  The inguinal floor was covered and the internal ring recreated.  The recreated internal ring allowed the cord structures though the ring, but not the tip of my finger.  The cord structures were returned to a normal location.  The external oblique was closed with a 3-0 vicryl.  The fascia and subcutaneous tissues were infiltrated with the Exparel mixture..  The skin was closed with 5-0 monocryl and painted with Dermabond. The sponge and needle count were correct at the end of the case.  The patient was transported to the recovery room in  good condition.  Because of the large hernia that contained colon, I am going to keep the patient overnight to see how he does.    Left scrotal hernia about 1/2 reduced (before the operation0.   Alphonsa Overall, MD, [facility] Pager: [phone] Office phone:  [phone]

## 2014-01-21 NOTE — Interval H&P Note (Signed)
History and Physical Interval Note:  01/21/2014 10:01 AM  Anthony Wise.  has presented today for surgery, with the diagnosis of large inguinal hernia   The various methods of treatment have been discussed with the patient and family.  His wife is here with him today.  After consideration of risks, benefits and other options for treatment, the patient has consented to  Procedure(s): OPEN LEFT INGUNIAL HERNIA REPAIR (Left) as a surgical intervention .  The patient's history has been reviewed, patient examined, no change in status, stable for surgery.  I have reviewed the patient's chart and labs.  Questions were answered to the patient's satisfaction.     Lorilee Cafarella H

## 2014-01-21 NOTE — Anesthesia Preprocedure Evaluation (Signed)
Anesthesia Evaluation  Patient identified by MRN, date of birth, ID band Patient awake    Reviewed: Allergy & Precautions, H&P , NPO status , Patient's Chart, lab work & pertinent test results  Airway Mallampati: II TM Distance: >3 FB Neck ROM: Full    Dental  (+) Edentulous Upper, Edentulous Lower and Dental Advisory Given   Pulmonary neg pulmonary ROS, Current Smoker,  breath sounds clear to auscultation  Pulmonary exam normal       Cardiovascular negative cardio ROS  Rhythm:Regular Rate:Normal     Neuro/Psych negative neurological ROS  negative psych ROS   GI/Hepatic negative GI ROS, Neg liver ROS,   Endo/Other  negative endocrine ROS  Renal/GU negative Renal ROS  negative genitourinary   Musculoskeletal negative musculoskeletal ROS (+)   Abdominal   Peds  Hematology negative hematology ROS (+)   Anesthesia Other Findings   Reproductive/Obstetrics                           Anesthesia Physical Anesthesia Plan  ASA: III  Anesthesia Plan: General   Post-op Pain Management:    Induction: Intravenous  Airway Management Planned: LMA and Oral ETT  Additional Equipment:   Intra-op Plan:   Post-operative Plan: Extubation in OR  Informed Consent: I have reviewed the patients History and Physical, chart, labs and discussed the procedure including the risks, benefits and alternatives for the proposed anesthesia with the patient or authorized representative who has indicated his/her understanding and acceptance.   Dental advisory given  Plan Discussed with: CRNA  Anesthesia Plan Comments:         Anesthesia Quick Evaluation

## 2014-01-22 NOTE — Progress Notes (Signed)
Assessment unchanged. Pt and wife verbalized understanding of dc instructions and My Chart through teach back. Discharged via wc to front entrance to meet awaiting vehicle to carry home. Accompanied by NT, wife and aunt.

## 2014-01-22 NOTE — Discharge Summary (Signed)
Physician Discharge Summary [facility]  Patient ID: Anthony Wise. MRN: [id number] DOB/AGE: 1946-07-27 68 y.o.  Admit date: 01/21/2014 Discharge date: 01/22/2014  Admission Diagnoses:  Incarcerated left inguinal hernia  Discharge Diagnoses:  Active Problems:   Incarcerated left inguinal hernia   Discharged Condition: good  Hospital Course: patient admitted post op for observation and pain control.  Post op course uncomplicated.  Pain well controlled.  Tolerating diet.  Prepared for discharge on POD#1.  Consults: None  Significant Diagnostic Studies: none  Treatments: surgery: LIH repair with mesh  Discharge Exam: Blood pressure 120/69, pulse 83, temperature 97.7 F (36.5 C), temperature source Oral, resp. rate 18, height 6\' 3"  (1.905 m), weight 170 lb 6.4 oz (77.293 kg), SpO2 94.00%. HEENT - clear Neck - soft Chest - clear bilaterally Cor - RRR Abd - soft without distension, BS present GU - left inguinal incision clear and dry and intact with Dermabond  Disposition: Home with family  Discharge Orders   Future Appointments Provider Department Dept Phone   02/03/2014 8:45 AM Shann Medal, MD [facility], [place] [phone]   Future Orders Complete By Expires   Diet - low sodium heart healthy  As directed    Discharge instructions  As directed    Comments:     [facility], [place]  Always review your discharge instruction sheet given to you by the facility where your surgery was performed.  A  prescription for pain medication may be given to you upon discharge.  Take your pain medication as prescribed.  If narcotic pain medicine is not needed, then you may take acetaminophen (Tylenol) or ibuprofen (Advil) as needed.  Take your usually prescribed medications unless otherwise directed.  If you need a refill on your pain medication, please contact your pharmacy.  They will contact our  office to request authorization. Prescriptions will not be filled after 5 pm daily or on weekends.  You should follow a light diet the first 24 hours after arrival home, such as soup and crackers or toast.  Be sure to include plenty of fluids daily.  Resume your normal diet the day after surgery.  Most patients will experience some swelling and bruising around the surgical site.  Ice packs and reclining will help.  Swelling and bruising can take several days to resolve.   It is common to experience some constipation if taking pain medication after surgery.  Increasing fluid intake and taking a stool softener (such as Colace) will usually help or prevent this problem from occurring.  A mild laxative (Milk of Magnesia or Miralax) should be taken according to package directions if there are no bowel movements after 48 hours.  Unless discharge instructions indicate otherwise, you may remove your bandages 24-48 hours after surgery, and you may shower at that time.  You may have steri-strips (small skin tapes) in place directly over the incision.  These strips should be left on the skin for 7-10 days.  If your surgeon used skin glue on the incision, you may shower in 24 hours.  The glue will flake off over the next 2-3 weeks.  Any sutures or staples will be removed at the office during your follow-up visit.  ACTIVITIES:  You may resume regular (light) daily activities beginning the next day-such as daily self-care, walking, climbing stairs-gradually increasing activities as tolerated.  You may have sexual intercourse when it is comfortable.  Refrain from any heavy lifting  or straining until approved by your doctor.  You may drive when you are no longer taking prescription pain medication, you can comfortably wear a seatbelt, and you can safely maneuver your car and apply brakes.  You should see your doctor in the office for a follow-up appointment approximately 2-3 weeks after your surgery.  Make sure that you  call for this appointment within a day or two after you arrive home to insure a convenient appointment time.   WHEN TO CALL YOUR DOCTOR: Fever greater than 101.0 Inability to urinate Persistent nausea and/or vomiting Extreme swelling or bruising Continued bleeding from incision Increased pain, redness, or drainage from the incision  The clinic staff is available to answer your questions during regular business hours.  Please don't hesitate to call and ask to speak to one of the nurses for clinical concerns.  If you have a medical emergency, go to the nearest emergency room or call 911.  A surgeon from [facility] is always on call for the hospital.   [facility] [address] [place], [place], [place]  [address]  [phone] ? [phone] ? FAX [phone]  [email or web address]   Increase activity slowly  As directed    No dressing needed  As directed        Medication List    Notice   You have not been prescribed any medications.       Earnstine Regal, MD, [facility] Office: [phone]   Signed: Earnstine Regal 01/22/2014, 9:24 AM

## 2014-01-24 ENCOUNTER — Encounter (HOSPITAL_COMMUNITY): Payer: Self-pay | Admitting: Surgery

## 2014-01-25 NOTE — Addendum Note (Signed)
Addendum created 01/25/14 1418 by Isa Rankin, CRNA   Modules edited: Anesthesia Events

## 2014-02-03 ENCOUNTER — Ambulatory Visit (INDEPENDENT_AMBULATORY_CARE_PROVIDER_SITE_OTHER): Payer: Medicare Other | Admitting: Surgery

## 2014-02-03 VITALS — Ht 75.0 in | Wt 163.0 lb

## 2014-02-03 DIAGNOSIS — K4030 Unilateral inguinal hernia, with obstruction, without gangrene, not specified as recurrent: Secondary | ICD-10-CM

## 2014-02-03 NOTE — Progress Notes (Signed)
   Re:   Anthony Wise. DOB:   1946/10/19 MRN:   [id number]  ASSESSMENT AND PLAN: 1.  Giant incarcerated left inguinal hernia - open repair - 01/21/2014 - D. Blessin Kanno  Has done well.  Return appointment is PRN.   2.  Smokes - has cut back to 1/2 ppd  He knows that he needs to quit this. 3.  Back issues.  Has seen a chiropractor remotely.  Chief Complaint  Patient presents with  . Routine Post Op    ing. hernia   REFERRING PHYSICIAN: Redge Gainer, MD  HISTORY OF PRESENT ILLNESS: Anthony Wise. is a 68 y.o. (DOB: December 22, 1946)  White  male whose primary care physician is Dr. Ernestina Wise (with  Anthony Gainer, MD) and comes to me today for a left inguinal hernia. He comes by himself.  History of inguinal hernia (Jan 2015): He has had a left inguinal hernia since at least 2009.  It has gotten steadily larger, but he decided to do nothing about it.  But recently he said that is has gotten "out of control".  He saw Dr. Ernestina Wise, who ordered a CT scan and referred the patient for further evaluation.  CT scan - 11/29/2014 - 1. There is a large left inguinal scrotal ventral hernia containing fat small bowel and large bowel without evidence of acute obstruction or incarceration. Mild stranding of the fat surrounding central mesenteric vessels suspicious for mild edema. The hernia is only partially visualized measures at least 7.5 by 10 cm  2. Nonspecific mild thickening of urinary bladder wall.  3. Extensive atherosclerotic vascular calcifications.  4. No hydronephrosis or hydroureter.  5. No pericecal inflammation.   Past Medical History  Diagnosis Date  . Blood pressure elevated 01/20/14    PT'S B/P ELEVATED TODAY   - PT STATES HIS B/P OK WHEN HE CHECKS AT HOME - HE IS ANXIOUS ABOUT HAVING SURGERY.     No current outpatient prescriptions on file.   No current facility-administered medications for this visit.     No Known Allergies  REVIEW OF SYSTEMS: Cardiac:  Atherosclerotic  disease on CT scan Pulmonary: Smokes 1/2 ppd.  He said that he has cut down from about 2 ppd. Musculoskeletal:  Has had some back pain, seen by a chiropractor in the past.  Had Baker's cyst removed from right knee about 1990  SOCIAL and FAMILY HISTORY: Married - wife Anthony Wise with him. Retired from the [employer] in 2008.  Worked in maintenance.  PHYSICAL EXAM: Ht 6\' 3"  (1.905 m)  Wt 163 lb (73.936 kg)  BMI 20.37 kg/m2  General: Thin WN WM who is alert and generally healthy appearing.  Abdomen:  Left groin incision looks good.  He has some left scrotal swelling, which should improve with time.  DATA REVIEWED: Notes in [place], MD,  [facility], [address]  [place], [place]    [place] Phone:  [place]:  [phone]

## 2014-12-06 ENCOUNTER — Encounter: Payer: Self-pay | Admitting: Family Medicine

## 2014-12-06 ENCOUNTER — Ambulatory Visit (INDEPENDENT_AMBULATORY_CARE_PROVIDER_SITE_OTHER): Payer: Medicare Other | Admitting: Family Medicine

## 2014-12-06 ENCOUNTER — Ambulatory Visit (INDEPENDENT_AMBULATORY_CARE_PROVIDER_SITE_OTHER): Payer: Medicare Other | Admitting: *Deleted

## 2014-12-06 VITALS — BP 135/83 | HR 82 | Temp 97.7°F | Ht 74.0 in | Wt 169.2 lb

## 2014-12-06 DIAGNOSIS — Z720 Tobacco use: Secondary | ICD-10-CM

## 2014-12-06 DIAGNOSIS — Z125 Encounter for screening for malignant neoplasm of prostate: Secondary | ICD-10-CM | POA: Diagnosis not present

## 2014-12-06 DIAGNOSIS — R5383 Other fatigue: Secondary | ICD-10-CM | POA: Diagnosis not present

## 2014-12-06 DIAGNOSIS — Z23 Encounter for immunization: Secondary | ICD-10-CM | POA: Diagnosis not present

## 2014-12-06 DIAGNOSIS — E785 Hyperlipidemia, unspecified: Secondary | ICD-10-CM | POA: Diagnosis not present

## 2014-12-06 DIAGNOSIS — Z0000 Encounter for general adult medical examination without abnormal findings: Secondary | ICD-10-CM

## 2014-12-06 LAB — POCT CBC
Granulocyte percent: 55.8 %G (ref 37–80)
HCT, POC: 41.8 % — AB (ref 43.5–53.7)
Hemoglobin: 14.2 g/dL (ref 14.1–18.1)
Lymph, poc: 1.6 (ref 0.6–3.4)
MCH, POC: 31.3 pg — AB (ref 27–31.2)
MCHC: 34 g/dL (ref 31.8–35.4)
MCV: 92.1 fL (ref 80–97)
MPV: 6.9 fL (ref 0–99.8)
POC Granulocyte: 2.7 (ref 2–6.9)
POC LYMPH PERCENT: 31.9 %L (ref 10–50)
Platelet Count, POC: 243 10*3/uL (ref 142–424)
RBC: 4.5 M/uL — AB (ref 4.69–6.13)
RDW, POC: 13.6 %
WBC: 4.9 10*3/uL (ref 4.6–10.2)

## 2014-12-06 NOTE — Progress Notes (Signed)
   Subjective:    Patient ID: Anthony Wise., male    DOB: [birth year], 68 y.o.   MRN: [id number]  HPI Patient is here for Cpe.  He is a long time smoker and has cut back from 2ppd to 1/2 pack per 2-3 days.  He has no acute complaints.  He has colonoscopy ordered early next year.  He had cxr in January this year which showed hyperaeration.    Review of Systems  Constitutional: Negative for fever.  HENT: Negative for ear pain.   Eyes: Negative for discharge.  Respiratory: Negative for cough.   Cardiovascular: Negative for chest pain.  Gastrointestinal: Negative for abdominal distention.  Endocrine: Negative for polyuria.  Genitourinary: Negative for difficulty urinating.  Musculoskeletal: Negative for gait problem and neck pain.  Skin: Negative for color change and rash.  Neurological: Negative for speech difficulty and headaches.  Psychiatric/Behavioral: Negative for agitation.       Objective:    BP 135/83 mmHg  Pulse 82  Temp(Src) 97.7 F (36.5 C) (Oral)  Ht _0  (1.88 m)  Wt 169 lb 3.2 oz (76.749 kg)  BMI 21.71 kg/m2 Physical Exam  Constitutional: He is oriented to person, place, and time. He appears well-developed and well-nourished.  HENT:  Head: Normocephalic and atraumatic.  Mouth/Throat: Oropharynx is clear and moist.  Eyes: Pupils are equal, round, and reactive to light.  Neck: Normal range of motion. Neck supple.  Cardiovascular: Normal rate and regular rhythm.   No murmur heard. Pulmonary/Chest: Effort normal and breath sounds normal.  Abdominal: Soft. Bowel sounds are normal. There is no tenderness.  Neurological: He is alert and oriented to person, place, and time.  Skin: Skin is warm and dry.  Psychiatric: He has a normal mood and affect.          Assessment & Plan:     ICD-9-CM ICD-10-CM   1. Routine general medical examination at a health care facility V70.0 Z00.00 POCT CBC     Lipid panel     CMP14+EGFR     PSA, total and free     TSH       Return in about 1 year (around 12/07/2015), or if symptoms worsen or fail to improve.  Lysbeth Penner FNP

## 2014-12-07 ENCOUNTER — Telehealth: Payer: Self-pay

## 2014-12-07 ENCOUNTER — Other Ambulatory Visit: Payer: Self-pay | Admitting: Family Medicine

## 2014-12-07 LAB — CMP14+EGFR
ALT: 18 IU/L (ref 0–44)
AST: 37 IU/L (ref 0–40)
Albumin/Globulin Ratio: 1.7 (ref 1.1–2.5)
Albumin: 4.4 g/dL (ref 3.6–4.8)
Alkaline Phosphatase: 65 IU/L (ref 39–117)
BUN/Creatinine Ratio: 9 — ABNORMAL LOW (ref 10–22)
BUN: 6 mg/dL — ABNORMAL LOW (ref 8–27)
CO2: 23 mmol/L (ref 18–29)
Calcium: 9.3 mg/dL (ref 8.6–10.2)
Chloride: 93 mmol/L — ABNORMAL LOW (ref 97–108)
Creatinine, Ser: 0.65 mg/dL — ABNORMAL LOW (ref 0.76–1.27)
GFR calc Af Amer: 116 mL/min/{1.73_m2} (ref >59)
GFR calc non Af Amer: 100 mL/min/{1.73_m2} (ref >59)
Globulin, Total: 2.6 g/dL (ref 1.5–4.5)
Glucose: 85 mg/dL (ref 65–99)
Potassium: 5 mmol/L (ref 3.5–5.2)
Sodium: 131 mmol/L — ABNORMAL LOW (ref 134–144)
Total Bilirubin: 0.9 mg/dL (ref 0.0–1.2)
Total Protein: 7 g/dL (ref 6.0–8.5)

## 2014-12-07 LAB — PSA, TOTAL AND FREE
PSA, Free Pct: 12.4 %
PSA, Free: 0.36 ng/mL
PSA: 2.9 ng/mL (ref 0.0–4.0)

## 2014-12-07 LAB — LIPID PANEL
Chol/HDL Ratio: 2 ratio units (ref 0.0–5.0)
Cholesterol, Total: 146 mg/dL (ref 100–199)
HDL: 72 mg/dL (ref >39)
LDL Calculated: 62 mg/dL (ref 0–99)
Triglycerides: 60 mg/dL (ref 0–149)
VLDL Cholesterol Cal: 12 mg/dL (ref 5–40)

## 2014-12-07 LAB — TSH: TSH: 2.56 u[IU]/mL (ref 0.450–4.500)

## 2014-12-07 NOTE — Telephone Encounter (Signed)
-----   Message from Lysbeth Penner, FNP sent at 12/07/2014 11:08 AM EST ----- NA is low and may be due to increased H20 intake before labs were drawn.  Recommend regular po intake of fluids no more than 6 glasses h20 in a day and repeat NA in 2 weeks, lipids look good, liver and kidney fx is ok and PSA is 2.9 and would recommend repeat in one year.

## 2014-12-07 NOTE — Telephone Encounter (Signed)
Pt aware of results and need for NA repeat in 2 weeks; Pt states he does not drink an excessive amount of water

## 2014-12-20 ENCOUNTER — Emergency Department (HOSPITAL_COMMUNITY)
Admission: EM | Admit: 2014-12-20 | Discharge: 2014-12-20 | Disposition: A | Discharge status: Home / Self Care | Payer: Medicare Other | Attending: Emergency Medicine | Admitting: Emergency Medicine

## 2014-12-20 ENCOUNTER — Emergency Department (HOSPITAL_COMMUNITY): Payer: Medicare Other

## 2014-12-20 ENCOUNTER — Encounter (HOSPITAL_COMMUNITY): Payer: Self-pay | Admitting: *Deleted

## 2014-12-20 DIAGNOSIS — K4090 Unilateral inguinal hernia, without obstruction or gangrene, not specified as recurrent: Secondary | ICD-10-CM | POA: Insufficient documentation

## 2014-12-20 DIAGNOSIS — K4091 Unilateral inguinal hernia, without obstruction or gangrene, recurrent: Secondary | ICD-10-CM

## 2014-12-20 DIAGNOSIS — Z720 Tobacco use: Secondary | ICD-10-CM | POA: Diagnosis not present

## 2014-12-20 DIAGNOSIS — K4031 Unilateral inguinal hernia, with obstruction, without gangrene, recurrent: Secondary | ICD-10-CM | POA: Diagnosis not present

## 2014-12-20 DIAGNOSIS — K6389 Other specified diseases of intestine: Secondary | ICD-10-CM | POA: Diagnosis not present

## 2014-12-20 DIAGNOSIS — R1032 Left lower quadrant pain: Secondary | ICD-10-CM | POA: Diagnosis present

## 2014-12-20 DIAGNOSIS — R1110 Vomiting, unspecified: Secondary | ICD-10-CM | POA: Diagnosis not present

## 2014-12-20 LAB — CBC
HEMATOCRIT: 41.3 % (ref 39.0–52.0)
HEMOGLOBIN: 14.5 g/dL (ref 13.0–17.0)
MCH: 31.3 pg (ref 26.0–34.0)
MCHC: 35.1 g/dL (ref 30.0–36.0)
MCV: 89.2 fL (ref 78.0–100.0)
Platelets: 203 10*3/uL (ref 150–400)
RBC: 4.63 MIL/uL (ref 4.22–5.81)
RDW: 13.4 % (ref 11.5–15.5)
WBC: 4.9 10*3/uL (ref 4.0–10.5)

## 2014-12-20 LAB — BASIC METABOLIC PANEL
Anion gap: 13 (ref 5–15)
BUN: 5 mg/dL — ABNORMAL LOW (ref 6–23)
CALCIUM: 9.2 mg/dL (ref 8.4–10.5)
CO2: 21 mmol/L (ref 19–32)
Chloride: 92 mEq/L — ABNORMAL LOW (ref 96–112)
Creatinine, Ser: 0.73 mg/dL (ref 0.50–1.35)
GFR calc Af Amer: >90 mL/min (ref >90)
GFR calc non Af Amer: >90 mL/min (ref >90)
GLUCOSE: 93 mg/dL (ref 70–99)
Potassium: 4 mmol/L (ref 3.5–5.1)
Sodium: 126 mmol/L — ABNORMAL LOW (ref 135–145)

## 2014-12-20 MED ORDER — ONDANSETRON HCL 4 MG/2ML IJ SOLN
INTRAMUSCULAR | Status: AC
  Filled 2014-12-20: qty 2

## 2014-12-20 MED ORDER — ONDANSETRON HCL 4 MG/2ML IJ SOLN
4.0000 mg | Freq: Once | INTRAMUSCULAR | Status: AC
  Administered 2014-12-20: 4 mg via INTRAVENOUS

## 2014-12-20 MED ORDER — SODIUM CHLORIDE 0.9 % IV BOLUS (SEPSIS)
1000.0000 mL | Freq: Once | INTRAVENOUS | Status: AC
  Administered 2014-12-20: 1000 mL via INTRAVENOUS

## 2014-12-20 MED ORDER — ONDANSETRON HCL 4 MG/2ML IJ SOLN: 4.0000 mg | Freq: Once | INTRAMUSCULAR | Status: DC

## 2014-12-20 MED ORDER — IOHEXOL 300 MG/ML  SOLN
100.0000 mL | Freq: Once | INTRAMUSCULAR | Status: AC | PRN
  Administered 2014-12-20: 100 mL via INTRAVENOUS

## 2014-12-20 NOTE — ED Notes (Signed)
Pt also reports not having a bowel movement in three days.

## 2014-12-20 NOTE — ED Notes (Signed)
Pt got up and walked to bathroom to urinate. After he urinated he vomited x1. Denies increased pain.

## 2014-12-20 NOTE — Discharge Instructions (Signed)

## 2014-12-20 NOTE — ED Notes (Signed)
Discharge and follow up reviewed.

## 2014-12-20 NOTE — ED Provider Notes (Signed)
CSN: [id number]     Arrival date & time 12/20/14  1458 History   First MD Initiated Contact with Patient 12/20/14 1733     Chief Complaint  Patient presents with  . Inguinal Hernia     (Consider location/radiation/quality/duration/timing/severity/associated sxs/prior Treatment) Patient is a 68 y.o. male presenting with male genitourinary complaint and general illness. The history is provided by the patient.  Male GU Problem Presenting symptoms comment:  L groin pain Context: spontaneously   Relieved by:  Nothing Worsened by:  Nothing tried Associated symptoms: no abdominal pain, no fever and no vomiting   Illness Location:  L inguinal canal Quality:  Hernia Severity:  Moderate Onset quality:  Sudden Timing:  Intermittent Progression:  Worsening Chronicity:  Recurrent Context:  Hx of inguinal hernia repair 11 months ago Relieved by:  Nothing Worsened by:  Nothing Associated symptoms: no abdominal pain, no cough, no fever, no shortness of breath and no vomiting     Past Medical History  Diagnosis Date  . Blood pressure elevated 01/20/14    PT'S B/P ELEVATED TODAY   - PT STATES HIS B/P OK WHEN HE CHECKS AT HOME - HE IS ANXIOUS ABOUT HAVING SURGERY.   Past Surgical History  Procedure Laterality Date  . Knee arthroscopy    . Tonsillectomy      AS A CHILD  . Inguinal hernia repair Left 01/21/2014    Procedure: OPEN LEFT INGUNIAL HERNIA REPAIR;  Surgeon: Shann Medal, MD;  Location: [facility];  Service: General;  Laterality: Left;  with Mesh   Family History  Problem Relation Age of Onset  . Diabetes Mother   . Stroke Mother    History  Substance Use Topics  . Smoking status: Current Every Day Smoker -- 2.00 packs/day for 50 years    Types: Cigarettes  . Smokeless tobacco: Never Used  . Alcohol Use: 0.0 oz/week     Comment: 6 TO 8 CANS BEER Aday     CUT BACK ON SMOKING ABOUT 2008  LAST USED MARIUANA 01/18/14    Review of Systems  Constitutional: Negative for fever.   Respiratory: Negative for cough and shortness of breath.   Gastrointestinal: Negative for vomiting and abdominal pain.  All other systems reviewed and are negative.     Allergies  Review of patient's allergies indicates no known allergies.  Home Medications   Prior to Admission medications   Not on File   BP 169/81 mmHg  Pulse 79  Temp(Src) 98.2 F (36.8 C) (Oral)  Resp 20  SpO2 93% Physical Exam  Constitutional: He is oriented to person, place, and time. He appears well-developed and well-nourished. No distress.  HENT:  Head: Normocephalic and atraumatic.  Mouth/Throat: No oropharyngeal exudate.  Eyes: EOM are normal. Pupils are equal, round, and reactive to light.  Neck: Normal range of motion. Neck supple.  Cardiovascular: Normal rate and regular rhythm.  Exam reveals no friction rub.   No murmur heard. Pulmonary/Chest: Effort normal and breath sounds normal. No respiratory distress. He has no wheezes. He has no rales.  Abdominal: He exhibits no distension. There is no tenderness. There is no rebound. A hernia is present. Hernia confirmed positive in the left inguinal area (moderate, with bowel, nontender, no erythema, soft).  Genitourinary: Right testis shows no mass, no swelling and no tenderness. Left testis shows no mass, no swelling and no tenderness.  Musculoskeletal: Normal range of motion. He exhibits no edema.  Neurological: He is alert and oriented to person, place,  and time.  Skin: He is not diaphoretic.  Nursing note and vitals reviewed.   ED Course  Hernia reduction Date/Time: 12/20/2014 7:02 PM Performed by: Evelina Bucy Authorized by: Evelina Bucy Consent: Verbal consent obtained. Local anesthesia used: no Patient sedated: no Patient tolerance: Patient tolerated the procedure well with no immediate complications Comments: Easily reduced with direct pressure after some time in trendelenburg and with ice pack placement   (including critical care  time) Labs Review Labs Reviewed - No data to display  Imaging Review Ct Abdomen Pelvis W Contrast  12/20/2014   CLINICAL DATA:  Left hernia repair 2015 January, vomiting  EXAM: CT ABDOMEN AND PELVIS WITH CONTRAST  TECHNIQUE: Multidetector CT imaging of the abdomen and pelvis was performed using the standard protocol following bolus administration of intravenous contrast.  CONTRAST:  128mL OMNIPAQUE IOHEXOL 300 MG/ML  SOLN  COMPARISON:  11/29/2013  FINDINGS: Lung bases are unremarkable. Sagittal images of the spine shows significant disc space flattening at L5-S1 level with endplate sclerotic changes.  Liver, pancreas, spleen and adrenal glands are unremarkable. Atherosclerotic calcifications of abdominal aorta and iliac arteries are noted.  Kidneys are symmetrical in size and enhancement. No hydronephrosis or hydroureter.  Delayed renal images shows bilateral renal symmetrical excretion. Bilateral visualized proximal ureter is unremarkable.  There is no small bowel obstruction. No ascites or free abdominal air. No calcified gallstones are noted within gallbladder. No pericecal inflammation.  There is moderate gaseous distension of the transverse colon. There is some gas in the distal left colon. Some gas is noted within rectum. Moderate gas noted in proximal sigmoid colon. There are post surgical changes post inguinal hernia repair in left inguinal region. The distal left colon is just above the previous surgical site. There is no definite evidence of colon entering the inguinal canal. I suspect in axial image 67 there is preserved a intact fat plane between the colon just above the surgical site. Mild thickening of inferior wall of the colon is probable due to scarring see coronal image 74. There is no definite evidence of colonic obstruction at this level.  There is thickening of urinary bladder wall. Clinical correlation is necessary to exclude cystitis. Persistent fat in left inguinal canal.  IMPRESSION:  1. No hydronephrosis or hydroureter.  No small bowel obstruction. 2. There are postsurgical post hernia repair in left inguinal region. No definite evidence of recurrent hernia. Although the inferior wall of the colon is in close proximity with the surgical site There is no evidence of colonic obstruction. Mild gaseous distended transverse colon and sigmoid colon probable due to mild colonic ileus. Clinical correlation is necessary. Persistent small amount fat and mild fat stranding in left inguinal canal. 3. Mild thickening of urinary bladder wall. Clinical correlation is necessary to exclude cystitis.   Electronically Signed   By: Lahoma Crocker M.D.   On: 12/20/2014 20:46     EKG Interpretation None      MDM   Final diagnoses:  Vomiting  Unilateral recurrent inguinal hernia without obstruction or gangrene   57M here with L inguinal hernia. Hx of incarcerated hernia repair by Dr. Lucia Gaskins about 11 months ago. Here with L inguinal hernia, non-reducible by him, nontender, no erythema.  Easily reducible after being placed in trendelenburg, ice pack application.  Began vomiting after hernia reduction just after ambuatling to the bathroom. CT done to look for possible obstruction signs since hernia out for 3 days. CT negative. Stable for discharge.  I have reviewed all labs  and imaging and considered them in my medical decision making.     Evelina Bucy, MD 12/21/14 [phone]

## 2014-12-20 NOTE — ED Notes (Signed)
Pt c/o left inguinal hernia pain. Pt was seen at [facility] for same complaint. Pt was told the hernia has "ruptured" and needed to be seen at [facility]. Pt reports having a hernia repair in January 2015. Pt reports intermittent pain for a month.

## 2015-01-02 DIAGNOSIS — H52203 Unspecified astigmatism, bilateral: Secondary | ICD-10-CM | POA: Diagnosis not present

## 2015-01-02 DIAGNOSIS — H2513 Age-related nuclear cataract, bilateral: Secondary | ICD-10-CM | POA: Diagnosis not present

## 2015-01-02 DIAGNOSIS — H5213 Myopia, bilateral: Secondary | ICD-10-CM | POA: Diagnosis not present

## 2015-01-26 DIAGNOSIS — K4091 Unilateral inguinal hernia, without obstruction or gangrene, recurrent: Secondary | ICD-10-CM | POA: Diagnosis not present

## 2015-02-06 ENCOUNTER — Other Ambulatory Visit (INDEPENDENT_AMBULATORY_CARE_PROVIDER_SITE_OTHER): Payer: Self-pay | Admitting: Surgery

## 2015-02-06 NOTE — Progress Notes (Signed)
Please put orders in Epic surgery 02-15-15 pre op 02-10-15 Thanks

## 2015-02-08 NOTE — Patient Instructions (Addendum)
[place].  02/08/2015   Your procedure is scheduled on: 02/15/2015    Report to [facility] Main  Entrance and follow signs to               [place] at     0700 AM.  Call this number if you have problems the morning of surgery [phone]   Remember:  Do not eat food or drink liquids :After Midnight.     Take these medicines the morning of surgery with A SIP OF WATER: none                                You may not have any metal on your body including hair pins and              piercings  Do not wear jewelry,  lotions, powders or perfumes.                        Men may shave face and neck.   Do not bring valuables to the hospital. [place].  Contacts, dentures or bridgework may not be worn into surgery. .     Patients discharged the day of surgery will not be allowed to drive home.  Name and phone number of your driver:  Special Instructions: coughing and deep breathing exercises, leg exercises               Please read over the following fact sheets you were given: _____________________________________________________________________             [facility] - Preparing for Surgery Before surgery, you can play an important role.  Because skin is not sterile, your skin needs to be as free of germs as possible.  You can reduce the number of germs on your skin by washing with CHG (chlorahexidine gluconate) soap before surgery.  CHG is an antiseptic cleaner which kills germs and bonds with the skin to continue killing germs even after washing. Please DO NOT use if you have an allergy to CHG or antibacterial soaps.  If your skin becomes reddened/irritated stop using the CHG and inform your nurse when you arrive at Short Stay. Do not shave (including legs and underarms) for at least 48 hours prior to the first CHG shower.  You may shave your face/neck. Please follow these instructions  carefully:  1.  Shower with CHG Soap the night before surgery and the  morning of Surgery.  2.  If you choose to wash your hair, wash your hair first as usual with your  normal  shampoo.  3.  After you shampoo, rinse your hair and body thoroughly to remove the  shampoo.                           4.  Use CHG as you would any other liquid soap.  You can apply chg directly  to the skin and wash                       Gently with a scrungie or clean washcloth.  5.  Apply the CHG Soap to your body ONLY  FROM THE NECK DOWN.   Do not use on face/ open                           Wound or open sores. Avoid contact with eyes, ears mouth and genitals (private parts).                       Wash face,  Genitals (private parts) with your normal soap.             6.  Wash thoroughly, paying special attention to the area where your surgery  will be performed.  7.  Thoroughly rinse your body with warm water from the neck down.  8.  DO NOT shower/wash with your normal soap after using and rinsing off  the CHG Soap.                9.  Pat yourself dry with a clean towel.            10.  Wear clean pajamas.            11.  Place clean sheets on your bed the night of your first shower and do not  sleep with pets. Day of Surgery : Do not apply any lotions/deodorants the morning of surgery.  Please wear clean clothes to the hospital/surgery center.  FAILURE TO FOLLOW THESE INSTRUCTIONS MAY RESULT IN THE CANCELLATION OF YOUR SURGERY PATIENT SIGNATURE_________________________________  NURSE SIGNATURE__________________________________  ________________________________________________________________________

## 2015-02-10 ENCOUNTER — Encounter (HOSPITAL_COMMUNITY)
Admission: RE | Admit: 2015-02-10 | Discharge: 2015-02-10 | Disposition: A | Discharge status: Home / Self Care | Payer: Medicare Other | Source: Ambulatory Visit | Attending: Surgery | Admitting: Surgery

## 2015-02-10 ENCOUNTER — Encounter (HOSPITAL_COMMUNITY): Payer: Self-pay

## 2015-02-10 DIAGNOSIS — Z01818 Encounter for other preprocedural examination: Secondary | ICD-10-CM | POA: Diagnosis not present

## 2015-02-10 DIAGNOSIS — K4090 Unilateral inguinal hernia, without obstruction or gangrene, not specified as recurrent: Secondary | ICD-10-CM | POA: Diagnosis not present

## 2015-02-13 NOTE — Progress Notes (Signed)
Cbc not done by Lab

## 2015-02-14 NOTE — H&P (Signed)
Anthony Wise. [facility] 01/26/2015 9:41 AM Location: [facility] Patient #: [id number] DOB: 1946-09-01 Married / Language: English / Race: White Male  History of Present Illness Patient words: hernia.  The patient is a 69 year old male who presents with non-malignant abdominal pain. His PCP is Dr. Alphonse Guild. He comes accompanied by his wife.  I repaired a large left inguinal hernia repair on 01/21/2014 on [name]. (There is a picture at the end of my op note to show the size of the hernia) The patient did well until around mid December 2015. He noticed a bulge in his left groin. He went to the urgent care in [place]. He was referred to the [facility] emergency room where he was identified as having a left inguinal hernia. He had ice placed on his hernia and it reduced. He had a CT scan done on 20 December 2014. The CT scan did not show any more hernia. Since the hernia was reduced, he has felt a bulge, but not had much pain.  He has a brother in law who had had 3 hernias repaired on one side. So he is worried about this. I spent a fair amount of time talking about quitting smoking. His wife quit smoking 4 years ago. I also discussed recurrent hernias.  I discussed the indications and complications of hernia surgery with the patient. I discussed both the laparoscopic and open approach to hernia repair.. The potential risks of hernia surgery include, but are not limited to, bleeding, infection, open surgery, nerve injury, and recurrence of the hernia. I provided the patient literature about hernia surgery. He could just watch this hernia, but his last hernia was very large. I would try to address this hernia earlier and not let it get as big as the prior hernia. I also think that he would be a good laparoscopic candidate for repair of the hernia. I did warn that the hernia may need to be repaired open again.  Past Medical History: 2. Smokes - has cut back to 1/2  ppd He knows that he needs to quit this. 3. Back issues. Has seen a chiropractor remotely.  SOCIAL and FAMILY HISTORY: Married - wife Dorian Pod with him. Retired from the [employer] in 2008. Worked in maintenance.  Addendum Note(Mcclain Shall H. Lucia Gaskins MD; 02/14/2015 5:18 PM) He has a rash on his leg. I will check him the day of surgery to decide whether to continue. DN 02/14/2015   Other Problems (Ammie Eversole, LPN; 1/44/8185 6:31 AM) Hemorrhoids Inguinal Hernia  Past Surgical History (Ammie Eversole, LPN; 4/97/0263 7:85 AM) Knee Surgery Right. Open Inguinal Hernia Surgery Left.  Diagnostic Studies History (Ammie Eversole, LPN; 8/85/0277 4:12 AM) Colonoscopy never  Allergies (Ammie Eversole, LPN; 8/78/6767 2:09 AM) No Known Drug Allergies01/28/2016  Medication History (Ammie Eversole, LPN; 4/70/9628 3:66 AM) No Current Medications  Social History (Ammie Eversole, LPN; 2/94/7654 6:50 AM) Alcohol use Moderate alcohol use. Caffeine use Carbonated beverages. No drug use Tobacco use Current every day smoker.  Family History Aleatha Borer, LPN; 3/54/6568 1:27 AM) Diabetes Mellitus Mother.  Review of Systems (Ammie Eversole LPN; 05/15/16 4:94 AM) General Not Present- Appetite Loss, Chills, Fatigue, Fever, Night Sweats, Weight Gain and Weight Loss. Skin Not Present- Change in Wart/Mole, Dryness, Hives, Jaundice, New Lesions, Non-Healing Wounds, Rash and Ulcer. HEENT Not Present- Earache, Hearing Loss, Hoarseness, Nose Bleed, Oral Ulcers, Ringing in the Ears, Seasonal Allergies, Sinus Pain, Sore Throat, Visual Disturbances, Wears glasses/contact lenses and Yellow Eyes. Respiratory  Not Present- Bloody sputum, Chronic Cough, Difficulty Breathing, Snoring and Wheezing. Breast Not Present- Breast Mass, Breast Pain, Nipple Discharge and Skin Changes. Cardiovascular Not Present- Chest Pain, Difficulty Breathing Lying Down, Leg Cramps, Palpitations, Rapid Heart  Rate, Shortness of Breath and Swelling of Extremities. Gastrointestinal Not Present- Abdominal Pain, Bloating, Bloody Stool, Change in Bowel Habits, Chronic diarrhea, Constipation, Difficulty Swallowing, Excessive gas, Gets full quickly at meals, Hemorrhoids, Indigestion, Nausea, Rectal Pain and Vomiting. Male Genitourinary Not Present- Blood in Urine, Change in Urinary Stream, Frequency, Impotence, Nocturia, Painful Urination, Urgency and Urine Leakage. Musculoskeletal Not Present- Back Pain, Joint Pain, Joint Stiffness, Muscle Pain, Muscle Weakness and Swelling of Extremities. Neurological Not Present- Decreased Memory, Fainting, Headaches, Numbness, Seizures, Tingling, Tremor, Trouble walking and Weakness. Psychiatric Not Present- Anxiety, Bipolar, Change in Sleep Pattern, Depression, Fearful and Frequent crying. Endocrine Not Present- Cold Intolerance, Excessive Hunger, Hair Changes, Heat Intolerance, Hot flashes and New Diabetes. Hematology Present- Easy Bruising. Not Present- Excessive bleeding, Gland problems, HIV and Persistent Infections.  Vitals (Ammie Eversole LPN; 4/66/5993 5:70 AM) 01/26/2015 9:42 AM Weight: 165 lb Height: 75in Body Surface Area: 1.99 m Body Mass Index: 20.62 kg/m Temp.: 98.57F(Oral)  Pulse: 80 (Regular)  BP: 160/82 (Sitting, Left Arm, Standard)   Physical Exam: General: Thin older WM alert and generally healthy appearing. HEENT: Normal. Pupils equal.  Neck: Supple. No mass. No thyroid mass. Lymph Nodes: No supraclavicular or cervical nodes.  Lungs: Clear to auscultation and symmetric breath sounds. Heart: RRR. No murmur or rub.  Abdomen: Soft. No mass. No tenderness. Normal bowel sounds. He has a left inguinal scar. In a standing position, he has a recurrent left inguinal hernia. The hernia is reducible in a supinie position. Rectal: Not done.  Extremities: Good strength and ROM in upper and lower extremities.  Neurologic: Grossly intact to  motor and sensory function. Psychiatric: Has normal mood and affect. Behavior is normal.  Assessment & Plan: RECURRENT LEFT INGUINAL HERNIA (550.91  K40.91) Story: Prior open left inguinal hernia repair - 01/21/2014 - D. Haydn Hutsell.   Impression: Would advise repair of recurrent left inguinal hernia.  Current Plans:   Schedule for Surgery

## 2015-02-15 ENCOUNTER — Ambulatory Visit (HOSPITAL_COMMUNITY): Payer: Medicare Other | Admitting: Anesthesiology

## 2015-02-15 ENCOUNTER — Ambulatory Visit (HOSPITAL_COMMUNITY)
Admission: RE | Admit: 2015-02-15 | Discharge: 2015-02-15 | Disposition: A | Discharge status: Home / Self Care | Payer: Medicare Other | Source: Ambulatory Visit | Attending: Surgery | Admitting: Surgery

## 2015-02-15 ENCOUNTER — Encounter (HOSPITAL_COMMUNITY): Payer: Self-pay | Admitting: *Deleted

## 2015-02-15 ENCOUNTER — Encounter (HOSPITAL_COMMUNITY)
Admission: RE | Disposition: A | Discharge status: Home / Self Care | Payer: Self-pay | Source: Ambulatory Visit | Attending: Surgery

## 2015-02-15 DIAGNOSIS — F17200 Nicotine dependence, unspecified, uncomplicated: Secondary | ICD-10-CM | POA: Insufficient documentation

## 2015-02-15 DIAGNOSIS — K4041 Unilateral inguinal hernia, with gangrene, recurrent: Secondary | ICD-10-CM | POA: Diagnosis not present

## 2015-02-15 DIAGNOSIS — K4091 Unilateral inguinal hernia, without obstruction or gangrene, recurrent: Secondary | ICD-10-CM | POA: Insufficient documentation

## 2015-02-15 DIAGNOSIS — K4090 Unilateral inguinal hernia, without obstruction or gangrene, not specified as recurrent: Secondary | ICD-10-CM | POA: Diagnosis not present

## 2015-02-15 HISTORY — DX: Rash and other nonspecific skin eruption: R21

## 2015-02-15 HISTORY — PX: INSERTION OF MESH: SHX5868

## 2015-02-15 HISTORY — PX: INGUINAL HERNIA REPAIR: SHX194

## 2015-02-15 LAB — CBC
HCT: 39.8 % (ref 39.0–52.0)
Hemoglobin: 14 g/dL (ref 13.0–17.0)
MCH: 31.9 pg (ref 26.0–34.0)
MCHC: 35.2 g/dL (ref 30.0–36.0)
MCV: 90.7 fL (ref 78.0–100.0)
PLATELETS: 172 10*3/uL (ref 150–400)
RBC: 4.39 MIL/uL (ref 4.22–5.81)
RDW: 13.3 % (ref 11.5–15.5)
WBC: 3.9 10*3/uL — ABNORMAL LOW (ref 4.0–10.5)

## 2015-02-15 SURGERY — REPAIR, HERNIA, INGUINAL, LAPAROSCOPIC
Anesthesia: General | Site: Abdomen | Laterality: Left

## 2015-02-15 MED ORDER — CEFAZOLIN SODIUM-DEXTROSE 2-3 GM-% IV SOLR
INTRAVENOUS | Status: AC
  Filled 2015-02-15: qty 50

## 2015-02-15 MED ORDER — SUCCINYLCHOLINE CHLORIDE 20 MG/ML IJ SOLN
INTRAMUSCULAR | Status: DC | PRN
  Administered 2015-02-15: 100 mg via INTRAVENOUS

## 2015-02-15 MED ORDER — FENTANYL CITRATE 0.05 MG/ML IJ SOLN
25.0000 ug | INTRAMUSCULAR | Status: DC | PRN
  Administered 2015-02-15 (×2): 50 ug via INTRAVENOUS

## 2015-02-15 MED ORDER — MEPERIDINE HCL 50 MG/ML IJ SOLN: 6.2500 mg | INTRAMUSCULAR | Status: DC | PRN

## 2015-02-15 MED ORDER — CHLORHEXIDINE GLUCONATE 4 % EX LIQD: 1.0000 "application " | Freq: Once | CUTANEOUS | Status: DC

## 2015-02-15 MED ORDER — HYDROCODONE-ACETAMINOPHEN 5-325 MG PO TABS: 1.0000 | ORAL_TABLET | Freq: Four times a day (QID) | ORAL | Status: DC | PRN

## 2015-02-15 MED ORDER — PROPOFOL 10 MG/ML IV BOLUS
INTRAVENOUS | Status: AC
  Filled 2015-02-15: qty 20

## 2015-02-15 MED ORDER — LIDOCAINE HCL (CARDIAC) 20 MG/ML IV SOLN
INTRAVENOUS | Status: AC
  Filled 2015-02-15: qty 5

## 2015-02-15 MED ORDER — FENTANYL CITRATE 0.05 MG/ML IJ SOLN
INTRAMUSCULAR | Status: DC | PRN
  Administered 2015-02-15 (×5): 50 ug via INTRAVENOUS

## 2015-02-15 MED ORDER — ONDANSETRON HCL 4 MG/2ML IJ SOLN
INTRAMUSCULAR | Status: DC | PRN
  Administered 2015-02-15: 4 mg via INTRAVENOUS

## 2015-02-15 MED ORDER — LACTATED RINGERS IV SOLN
INTRAVENOUS | Status: DC | PRN
  Administered 2015-02-15: 08:00:00 via INTRAVENOUS

## 2015-02-15 MED ORDER — PROPOFOL 10 MG/ML IV BOLUS
INTRAVENOUS | Status: DC | PRN
  Administered 2015-02-15: 200 mg via INTRAVENOUS

## 2015-02-15 MED ORDER — NEOSTIGMINE METHYLSULFATE 10 MG/10ML IV SOLN
INTRAVENOUS | Status: DC | PRN
  Administered 2015-02-15: 4 mg via INTRAVENOUS

## 2015-02-15 MED ORDER — LACTATED RINGERS IV SOLN
INTRAVENOUS | Status: DC
Start: 2015-02-15 — End: 2015-02-15

## 2015-02-15 MED ORDER — DEXAMETHASONE SODIUM PHOSPHATE 10 MG/ML IJ SOLN
INTRAMUSCULAR | Status: AC
  Filled 2015-02-15: qty 1

## 2015-02-15 MED ORDER — ONDANSETRON HCL 4 MG/2ML IJ SOLN
INTRAMUSCULAR | Status: AC
  Filled 2015-02-15: qty 2

## 2015-02-15 MED ORDER — GLYCOPYRROLATE 0.2 MG/ML IJ SOLN
INTRAMUSCULAR | Status: DC | PRN
  Administered 2015-02-15: 0.6 mg via INTRAVENOUS

## 2015-02-15 MED ORDER — BUPIVACAINE HCL (PF) 0.25 % IJ SOLN
INTRAMUSCULAR | Status: DC | PRN
  Administered 2015-02-15: 10 mL

## 2015-02-15 MED ORDER — CEFAZOLIN SODIUM-DEXTROSE 2-3 GM-% IV SOLR
2.0000 g | INTRAVENOUS | Status: AC
  Administered 2015-02-15: 2 g via INTRAVENOUS

## 2015-02-15 MED ORDER — LIDOCAINE HCL (CARDIAC) 20 MG/ML IV SOLN
INTRAVENOUS | Status: DC | PRN
  Administered 2015-02-15: 100 mg via INTRAVENOUS

## 2015-02-15 MED ORDER — PROMETHAZINE HCL 25 MG/ML IJ SOLN: 6.2500 mg | INTRAMUSCULAR | Status: DC | PRN

## 2015-02-15 MED ORDER — HYDROCODONE-ACETAMINOPHEN 5-325 MG PO TABS
1.0000 | ORAL_TABLET | Freq: Four times a day (QID) | ORAL | Status: DC | PRN
  Administered 2015-02-15: 1 via ORAL
  Filled 2015-02-15: qty 1

## 2015-02-15 MED ORDER — DEXAMETHASONE SODIUM PHOSPHATE 10 MG/ML IJ SOLN
INTRAMUSCULAR | Status: DC | PRN
  Administered 2015-02-15: 10 mg via INTRAVENOUS

## 2015-02-15 MED ORDER — 0.9 % SODIUM CHLORIDE (POUR BTL) OPTIME
TOPICAL | Status: DC | PRN
Start: 2015-02-15 — End: 2015-02-15
  Administered 2015-02-15: 1000 mL

## 2015-02-15 MED ORDER — ROCURONIUM BROMIDE 100 MG/10ML IV SOLN
INTRAVENOUS | Status: DC | PRN
  Administered 2015-02-15: 30 mg via INTRAVENOUS
  Administered 2015-02-15 (×2): 10 mg via INTRAVENOUS

## 2015-02-15 MED ORDER — BUPIVACAINE HCL (PF) 0.25 % IJ SOLN
INTRAMUSCULAR | Status: AC
  Filled 2015-02-15: qty 30

## 2015-02-15 MED ORDER — LACTATED RINGERS IR SOLN
Status: DC | PRN
  Administered 2015-02-15: 1000 mL

## 2015-02-15 MED ORDER — ROCURONIUM BROMIDE 100 MG/10ML IV SOLN
INTRAVENOUS | Status: AC
  Filled 2015-02-15: qty 1

## 2015-02-15 MED ORDER — MIDAZOLAM HCL 2 MG/2ML IJ SOLN
INTRAMUSCULAR | Status: AC
  Filled 2015-02-15: qty 2

## 2015-02-15 MED ORDER — EPHEDRINE SULFATE 50 MG/ML IJ SOLN
INTRAMUSCULAR | Status: DC | PRN
  Administered 2015-02-15: 5 mg via INTRAVENOUS

## 2015-02-15 MED ORDER — MIDAZOLAM HCL 5 MG/5ML IJ SOLN
INTRAMUSCULAR | Status: DC | PRN
  Administered 2015-02-15: 2 mg via INTRAVENOUS

## 2015-02-15 MED ORDER — FENTANYL CITRATE 0.05 MG/ML IJ SOLN
INTRAMUSCULAR | Status: AC
  Filled 2015-02-15: qty 5

## 2015-02-15 MED ORDER — FENTANYL CITRATE 0.05 MG/ML IJ SOLN
INTRAMUSCULAR | Status: DC
Start: 2015-02-15 — End: 2015-02-15
  Filled 2015-02-15: qty 2

## 2015-02-15 SURGICAL SUPPLY — 39 items
APL SKNCLS STERI-STRIP NONHPOA (GAUZE/BANDAGES/DRESSINGS)
BENZOIN TINCTURE PRP APPL 2/3 (GAUZE/BANDAGES/DRESSINGS) ×1 IMPLANT
CHLORAPREP W/TINT 26ML (MISCELLANEOUS) ×3 IMPLANT
CLOSURE WOUND 1/2 X4 (GAUZE/BANDAGES/DRESSINGS)
DECANTER SPIKE VIAL GLASS SM (MISCELLANEOUS) ×1 IMPLANT
DISSECT BALLN SPACEMKR + OVL (BALLOONS) ×3
DISSECTOR BALLN SPACEMKR + OVL (BALLOONS) ×1 IMPLANT
DISSECTOR BLUNT TIP ENDO 5MM (MISCELLANEOUS) IMPLANT
DRAPE LAPAROSCOPIC ABDOMINAL (DRAPES) ×3 IMPLANT
ELECT REM PT RETURN 9FT ADLT (ELECTROSURGICAL) ×3
ELECTRODE REM PT RTRN 9FT ADLT (ELECTROSURGICAL) ×1 IMPLANT
ENDOLOOP SUT PDS II  0 18 (SUTURE) ×4
ENDOLOOP SUT PDS II 0 18 (SUTURE) IMPLANT
GLOVE BIOGEL PI IND STRL 7.0 (GLOVE) ×1 IMPLANT
GLOVE BIOGEL PI INDICATOR 7.0 (GLOVE) ×4
GLOVE SURG SIGNA 7.5 PF LTX (GLOVE) ×3 IMPLANT
GOWN SPEC L4 XLG W/TWL (GOWN DISPOSABLE) ×7 IMPLANT
GOWN STRL REUS W/ TWL XL LVL3 (GOWN DISPOSABLE) ×3 IMPLANT
GOWN STRL REUS W/TWL LRG LVL3 (GOWN DISPOSABLE) ×3 IMPLANT
GOWN STRL REUS W/TWL XL LVL3 (GOWN DISPOSABLE)
KIT BASIN OR (CUSTOM PROCEDURE TRAY) ×3 IMPLANT
LIQUID BAND (GAUZE/BANDAGES/DRESSINGS) ×2 IMPLANT
MESH 3DMAX LIGHT 4.1X6.2 LT LR (Mesh General) ×3 IMPLANT
NDL INSUFFLATION 14GA 120MM (NEEDLE) IMPLANT
NEEDLE INSUFFLATION 14GA 120MM (NEEDLE) IMPLANT
SCISSORS LAP 5X35 DISP (ENDOMECHANICALS) ×2 IMPLANT
SET IRRIG TUBING LAPAROSCOPIC (IRRIGATION / IRRIGATOR) ×2 IMPLANT
SOLUTION ANTI FOG 6CC (MISCELLANEOUS) ×1 IMPLANT
STRIP CLOSURE SKIN 1/2X4 (GAUZE/BANDAGES/DRESSINGS) ×1 IMPLANT
SUT MON AB 5-0 PS2 18 (SUTURE) ×3 IMPLANT
SUT VIC AB 2-0 SH 27 (SUTURE) ×3
SUT VIC AB 2-0 SH 27X BRD (SUTURE) IMPLANT
SUT VIC AB 3-0 SH 18 (SUTURE) ×3 IMPLANT
SUT VIC AB 5-0 PS2 18 (SUTURE) IMPLANT
TACKER 5MM HERNIA 3.5CML NAB (ENDOMECHANICALS) ×5 IMPLANT
TOWEL OR 17X26 10 PK STRL BLUE (TOWEL DISPOSABLE) ×3 IMPLANT
TRAY LAPAROSCOPIC (CUSTOM PROCEDURE TRAY) ×3 IMPLANT
TROCAR BLADELESS OPT 5 75 (ENDOMECHANICALS) ×6 IMPLANT
TUBING INSUFFLATION 10FT LAP (TUBING) ×1 IMPLANT

## 2015-02-15 NOTE — Transfer of Care (Signed)
Immediate Anesthesia Transfer of Care Note  Patient: Anthony Wise.  Procedure(s) Performed: Procedure(s): LAPAROSCOPIC LEFT  INGUINAL HERNIA REPAIR (Left) INSERTION OF MESH (Left)  Patient Location: PACU  Anesthesia Type:General  Level of Consciousness: sedated  Airway & Oxygen Therapy: Patient Spontanous Breathing and Patient connected to face mask oxygen  Post-op Assessment: Report given to RN and Post -op Vital signs reviewed and stable  Post vital signs: Reviewed and stable  Last Vitals:  Filed Vitals:   02/15/15 0700  BP: 143/93  Pulse: 67  Temp: 36.6 C  Resp: 18    Complications: No apparent anesthesia complications

## 2015-02-15 NOTE — Anesthesia Preprocedure Evaluation (Signed)
Anesthesia Evaluation  Patient identified by MRN, date of birth, ID band Patient awake    Reviewed: Allergy & Precautions, H&P , NPO status , Patient's Chart, lab work & pertinent test results  Airway Mallampati: II  TM Distance: >3 FB Neck ROM: Full    Dental  (+) Edentulous Upper, Edentulous Lower, Dental Advisory Given   Pulmonary neg pulmonary ROS, Current Smoker,  breath sounds clear to auscultation  Pulmonary exam normal       Cardiovascular negative cardio ROS  Rhythm:Regular Rate:Normal     Neuro/Psych negative neurological ROS  negative psych ROS   GI/Hepatic negative GI ROS, Neg liver ROS,   Endo/Other  negative endocrine ROS  Renal/GU negative Renal ROS  negative genitourinary   Musculoskeletal negative musculoskeletal ROS (+)   Abdominal   Peds  Hematology negative hematology ROS (+)   Anesthesia Other Findings   Reproductive/Obstetrics                             Anesthesia Physical  Anesthesia Plan  ASA: III  Anesthesia Plan: General   Post-op Pain Management:    Induction: Intravenous  Airway Management Planned: Oral ETT  Additional Equipment:   Intra-op Plan:   Post-operative Plan: Extubation in OR  Informed Consent: I have reviewed the patients History and Physical, chart, labs and discussed the procedure including the risks, benefits and alternatives for the proposed anesthesia with the patient or authorized representative who has indicated his/her understanding and acceptance.   Dental advisory given  Plan Discussed with: CRNA  Anesthesia Plan Comments:         Anesthesia Quick Evaluation

## 2015-02-15 NOTE — Anesthesia Procedure Notes (Signed)
Procedure Name: Intubation Date/Time: 02/15/2015 8:42 AM Performed by: Lind Covert Pre-anesthesia Checklist: Patient identified, Emergency Drugs available, Suction available, Patient being monitored and Timeout performed Patient Re-evaluated:Patient Re-evaluated prior to inductionOxygen Delivery Method: Circle system utilized Preoxygenation: Pre-oxygenation with 100% oxygen Intubation Type: IV induction Laryngoscope Size: Mac and 4 Grade View: Grade I Tube type: Oral Tube size: 7.5 mm Number of attempts: 1 Airway Equipment and Method: Stylet Placement Confirmation: ETT inserted through vocal cords under direct vision,  breath sounds checked- equal and bilateral and positive ETCO2 Secured at: 22 cm Tube secured with: Tape Dental Injury: Teeth and Oropharynx as per pre-operative assessment

## 2015-02-15 NOTE — Interval H&P Note (Signed)
History and Physical Interval Note:  02/15/2015 8:31 AM  Anthony Wise.  has presented today for surgery, with the diagnosis of recurrent left inguinal hernia  The various methods of treatment have been discussed with the patient and family.  He has a rash on his legs that he has had for a while.  It should not delay the surgery.  His wife is here with him today.  After consideration of risks, benefits and other options for treatment, the patient has consented to  Procedure(s): LAPAROSCOPIC LEFT  INGUINAL HERNIA REPAIR (Left) INSERTION OF MESH (Left) as a surgical intervention .  The patient's history has been reviewed, patient examined, no change in status, stable for surgery.  I have reviewed the patient's chart and labs.  Questions were answered to the patient's satisfaction.     Effie Janoski H

## 2015-02-15 NOTE — Op Note (Addendum)
02/15/2015  10:31 AM  PATIENT:  Anthony Rossetti., 69 y.o., male  MRN: [id number]  DOB: 19-Mar-1946  PREOP DIAGNOSIS:  recurrent left inguinal hernia  POSTOP DIAGNOSIS:   recurrent left inguinal hernia, indirect  PROCEDURE:   Procedure(s):  LAPAROSCOPIC LEFT  INGUINAL HERNIA REPAIR, INSERTION OF MESH  [photo of repair in chart]  SURGEON:   Alphonsa Overall, M.D.  ANESTHESIA:   general  Anesthesiologist: Montez Hageman, MD CRNA: Bailey Mech, CRNA; Lind Covert, CRNA  General  EBL:  minimal  ml  LOCAL MEDICATIONS USED:   30 cc 1/4% marcaine  SPECIMEN:   none  COUNTS CORRECT:  YES  INDICATIONS FOR PROCEDURE:  Anthony Buckwalter. is a 69 y.o. (DOB: 05/17/1946) white  male whose primary care physician is Redge Gainer, MD and comes for repair of left inguinal hernia repair.   He had a prior left inguinal hernia repair on 01/21/2015 for a giant sliding indirect left inguinal hernia.  He noticed a bulge in his left inguinal area around December, 2015.   The indications and risks of the hernia surgery were explained to the patient.  The risks include, but are not limited to, infection, bleeding, recurrence of the hernia, and nerve injury.  Operative Note:  The patient was taken to room number 11 at [facility].  He underwent a general anesthesia.     A time out was held and the surgical checklist run.   His lower abdomen was shaved and then prepped with chloroprep.  he  had a foley catheter placed.   I made an infraumbilical incision and cut down to the rectus fascia.  I went the left side, opened the anterior rectus fascia, retracted the rectus muscle and placed the PBD balloon in the preperitoneal space.  The balloon was insufflated under direct visualization.  The balloon was in the correct position and the dissection created a preperitoneal space.   The patient had a indirect left inguinal hernia.  The inguinal floor looks good and there is no weakness.  Lateral to the inferior  epigastric vessels, the patient had a medium sized left inguinal hernia.  The hernia sac was stuck where I had done the prior repair.  I dissected the preperitoneal fat posteriorly.  The cord structures were encircled.   The peritoneum was reduced to below the level of the anterior iliac spine.  I did tear the peritoneum in getting sac down.  I closed this with two 0 PDS endoloops and a 2-0 Vicryl suture.   I then placed the mesh in the preperitoneal space.  I used the Bard 3D Max Light Mesh (10.3 cm x 15.7 cm).  The mesh lay flat.  I used the Protac to secure the mesh.  I used 10 clips.  The mesh was tacked medially to the pubic bone, inferiorly to Cooper's ligament, superiorly to transversalis fascia, and laterally.  I tried to make sure that I had good coverage over the internal ring.   I avoid tacks in the area lateral to the iliac vessels and laterally below the ileo-inguinal ligament.   Photos were taken and placed in the chart.  The preperitoneal space was deflated, there were no gaps around the mesh, and the trocars removed under direct visualization.   I infiltrated the wounds with 30 cc of 1/4% Marcaine.  The anterior rectus fascia was closed with 0 Vicryl.  The skin was closed with 5-0 monocryl and painted with Dermabond. The sponge and needle count  were correct at the end of the case.  The patient was transported to the recovery room in good condition.  His foley was removed.   The patient will go home today.  Discharge instructions reviewed.  Alphonsa Overall, MD, [facility] Pager: [phone] Office phone:  [phone]

## 2015-02-15 NOTE — Discharge Instructions (Signed)
[facility] - DISCHARGE INSTRUCTIONS TO PATIENT  Activity:  Driving - May drive in 3 or 4 days, if doing well   Lifting - No lifting more than 15 pounds for one month  Wound Care:   Leave incisions dry for 2 days, then may shower  Diet:  Eat lightly for a day or two, then no limit  Follow up appointment:  Call Dr. Pollie Friar office [facility]) at [phone] for an appointment in 2 to 3 weeks  Medications and dosages:  Resume your home medications.  You have a prescription for:  Vicodin  Call Dr. Lucia Gaskins or his office  [phone]) if you have:  Temperature greater than 100.4,  Persistent nausea and vomiting,  Severe uncontrolled pain,  Redness, tenderness, or signs of infection (pain, swelling, redness, odor or green/yellow discharge around the site),  Difficulty breathing, headache or visual disturbances,  Any other questions or concerns you may have after discharge.  In an emergency, call 911 or go to an Emergency Department at a nearby hospital.

## 2015-02-16 ENCOUNTER — Encounter (HOSPITAL_COMMUNITY): Payer: Self-pay | Admitting: Surgery

## 2015-02-16 NOTE — Anesthesia Postprocedure Evaluation (Signed)
  Anesthesia Post-op Note  Patient: Anthony Wise.  Procedure(s) Performed: Procedure(s) (LRB): LAPAROSCOPIC LEFT  INGUINAL HERNIA REPAIR (Left) INSERTION OF MESH (Left)  Patient Location: PACU  Anesthesia Type: General  Level of Consciousness: awake and alert   Airway and Oxygen Therapy: Patient Spontanous Breathing  Post-op Pain: mild  Post-op Assessment: Post-op Vital signs reviewed, Patient's Cardiovascular Status Stable, Respiratory Function Stable, Patent Airway and No signs of Nausea or vomiting  Last Vitals:  Filed Vitals:   02/15/15 1132  BP: 124/70  Pulse: 76  Temp: 36.3 C  Resp: 16    Post-op Vital Signs: stable   Complications: No apparent anesthesia complications

## 2015-07-05 DIAGNOSIS — H2513 Age-related nuclear cataract, bilateral: Secondary | ICD-10-CM | POA: Diagnosis not present

## 2015-08-14 DIAGNOSIS — H5213 Myopia, bilateral: Secondary | ICD-10-CM | POA: Diagnosis not present

## 2015-08-14 DIAGNOSIS — H5000 Unspecified esotropia: Secondary | ICD-10-CM | POA: Diagnosis not present

## 2015-08-14 DIAGNOSIS — H524 Presbyopia: Secondary | ICD-10-CM | POA: Diagnosis not present

## 2015-08-14 DIAGNOSIS — H25813 Combined forms of age-related cataract, bilateral: Secondary | ICD-10-CM | POA: Diagnosis not present

## 2015-10-03 DIAGNOSIS — H2511 Age-related nuclear cataract, right eye: Secondary | ICD-10-CM | POA: Diagnosis not present

## 2015-10-03 DIAGNOSIS — H25011 Cortical age-related cataract, right eye: Secondary | ICD-10-CM | POA: Diagnosis not present

## 2015-10-03 DIAGNOSIS — H25041 Posterior subcapsular polar age-related cataract, right eye: Secondary | ICD-10-CM | POA: Diagnosis not present

## 2015-10-03 DIAGNOSIS — H25811 Combined forms of age-related cataract, right eye: Secondary | ICD-10-CM | POA: Diagnosis not present

## 2015-10-10 DIAGNOSIS — H2512 Age-related nuclear cataract, left eye: Secondary | ICD-10-CM | POA: Diagnosis not present

## 2015-10-10 DIAGNOSIS — H25812 Combined forms of age-related cataract, left eye: Secondary | ICD-10-CM | POA: Diagnosis not present

## 2015-10-10 DIAGNOSIS — H25032 Anterior subcapsular polar age-related cataract, left eye: Secondary | ICD-10-CM | POA: Diagnosis not present

## 2015-12-06 ENCOUNTER — Other Ambulatory Visit: Payer: Self-pay | Admitting: Gastroenterology

## 2015-12-06 DIAGNOSIS — Z1211 Encounter for screening for malignant neoplasm of colon: Secondary | ICD-10-CM | POA: Diagnosis not present

## 2015-12-06 DIAGNOSIS — D127 Benign neoplasm of rectosigmoid junction: Secondary | ICD-10-CM | POA: Diagnosis not present

## 2017-01-17 DIAGNOSIS — Z23 Encounter for immunization: Secondary | ICD-10-CM | POA: Diagnosis not present

## 2017-04-17 ENCOUNTER — Ambulatory Visit (INDEPENDENT_AMBULATORY_CARE_PROVIDER_SITE_OTHER): Payer: Medicare Other | Admitting: Family Medicine

## 2017-04-17 ENCOUNTER — Telehealth: Payer: Self-pay | Admitting: Pediatrics

## 2017-04-17 ENCOUNTER — Encounter: Payer: Self-pay | Admitting: Family Medicine

## 2017-04-17 VITALS — BP 116/66 | HR 73 | Temp 98.1°F | Ht 75.0 in | Wt 162.0 lb

## 2017-04-17 DIAGNOSIS — L249 Irritant contact dermatitis, unspecified cause: Secondary | ICD-10-CM

## 2017-04-17 DIAGNOSIS — L03116 Cellulitis of left lower limb: Secondary | ICD-10-CM

## 2017-04-17 MED ORDER — SULFAMETHOXAZOLE-TRIMETHOPRIM 800-160 MG PO TABS
1.0000 | ORAL_TABLET | Freq: Two times a day (BID) | ORAL | 0 refills | Status: DC
Start: 2017-04-17 — End: 2018-04-02

## 2017-04-17 MED ORDER — PREDNISONE 20 MG PO TABS: ORAL_TABLET | ORAL | 0 refills | Status: DC

## 2017-04-17 MED ORDER — NYSTATIN-TRIAMCINOLONE 100000-0.1 UNIT/GM-% EX OINT: 1.0000 "application " | TOPICAL_OINTMENT | Freq: Two times a day (BID) | CUTANEOUS | 0 refills | Status: DC

## 2017-04-17 NOTE — Progress Notes (Signed)
BP 116/66   Pulse 73   Temp 98.1 F (36.7 C) (Oral)   Ht 6\' 3"  (1.905 m)   Wt 162 lb (73.5 kg)   BMI 20.25 kg/m    Subjective:    Patient ID: Anthony Rossetti., male    DOB: September 24, 1946, 71 y.o.   MRN: [id number]  HPI: Anthony Harkleroad. is a 71 y.o. male presenting on 04/17/2017 for Itchy rash/wound on left ankle/leg (began as a small spot about 4 weeks ago and has spread, using antibiotic cream)   HPI Rash Patient has an itchy rash on his left ankle on the inside that began as a small spot about 4 weeks ago and has been very pruritic and he spread it. He is using an antibiotic cream that has been helping some but it keeps spreading and he keeps itching at it. He denies any fevers or chills. He does say that is having some clear to yellow drainage. He has 2 small spots on his right leg but they did not start out looking the same as the other spot that started on this leg.  Relevant past medical, surgical, family and social history reviewed and updated as indicated. Interim medical history since our last visit reviewed. Allergies and medications reviewed and updated.  Review of Systems  Constitutional: Negative for chills and fever.  Respiratory: Negative for shortness of breath and wheezing.   Cardiovascular: Negative for chest pain and leg swelling.  Musculoskeletal: Negative for back pain and gait problem.  Skin: Positive for color change and rash.  All other systems reviewed and are negative.  Per HPI unless specifically indicated above     Objective:    BP 116/66   Pulse 73   Temp 98.1 F (36.7 C) (Oral)   Ht 6\' 3"  (1.905 m)   Wt 162 lb (73.5 kg)   BMI 20.25 kg/m   Wt Readings from Last 3 Encounters:  04/17/17 162 lb (73.5 kg)  02/15/15 167 lb (75.8 kg)  02/10/15 167 lb (75.8 kg)    Physical Exam  Constitutional: He is oriented to person, place, and time. He appears well-developed and well-nourished. No distress.  Eyes: Conjunctivae are normal. No scleral  icterus.  Musculoskeletal: Normal range of motion. He exhibits no edema.  Neurological: He is alert and oriented to person, place, and time. Coordination normal.  Skin: Skin is warm and dry. Rash (Crusted rash with surrounding erythema extending from medial malleolus on his left leg up about 5 cm. Areas of honey golden crusting and some clear to straw-colored drainage and swelling noted. Slightly warm to palpation as well.) noted. He is not diaphoretic.  Psychiatric: He has a normal mood and affect. His behavior is normal.  Nursing note and vitals reviewed.     Assessment & Plan:   Problem List Items Addressed This Visit    None    Visit Diagnoses    Irritant contact dermatitis, unspecified trigger    -  Primary   Relevant Medications   sulfamethoxazole-trimethoprim (BACTRIM DS,SEPTRA DS) 800-160 MG tablet   predniSONE (DELTASONE) 20 MG tablet   nystatin-triamcinolone ointment (MYCOLOG)   Cellulitis of left lower extremity       Relevant Medications   sulfamethoxazole-trimethoprim (BACTRIM DS,SEPTRA DS) 800-160 MG tablet   predniSONE (DELTASONE) 20 MG tablet   nystatin-triamcinolone ointment (MYCOLOG)     It appears that the rash started out as some kind of contact dermatitis versus chigger bites versus a fungal rash, will treat  for the infection that is there now.   Follow up plan: Return if symptoms worsen or fail to improve.  Counseling provided for all of the vaccine components No orders of the defined types were placed in this encounter.   Caryl Pina, MD [facility] 04/17/2017, 3:21 PM

## 2017-04-18 NOTE — Telephone Encounter (Signed)
That is fine and is separated into 2 prescriptions and use both

## 2017-04-18 NOTE — Telephone Encounter (Signed)
Aware. 

## 2017-10-15 DIAGNOSIS — Z23 Encounter for immunization: Secondary | ICD-10-CM | POA: Diagnosis not present

## 2018-04-02 ENCOUNTER — Encounter: Payer: Self-pay | Admitting: Pediatrics

## 2018-04-02 ENCOUNTER — Ambulatory Visit (INDEPENDENT_AMBULATORY_CARE_PROVIDER_SITE_OTHER): Payer: Medicare Other | Admitting: Pediatrics

## 2018-04-02 VITALS — BP 132/68 | HR 71 | Temp 96.5°F | Ht 75.0 in | Wt 174.4 lb

## 2018-04-02 DIAGNOSIS — Z1159 Encounter for screening for other viral diseases: Secondary | ICD-10-CM

## 2018-04-02 DIAGNOSIS — E785 Hyperlipidemia, unspecified: Secondary | ICD-10-CM | POA: Diagnosis not present

## 2018-04-02 DIAGNOSIS — M7989 Other specified soft tissue disorders: Secondary | ICD-10-CM | POA: Diagnosis not present

## 2018-04-02 DIAGNOSIS — Z720 Tobacco use: Secondary | ICD-10-CM | POA: Diagnosis not present

## 2018-04-02 DIAGNOSIS — R7309 Other abnormal glucose: Secondary | ICD-10-CM | POA: Diagnosis not present

## 2018-04-02 NOTE — Progress Notes (Signed)
  Subjective:   Patient ID: Anthony Wise., male    DOB: 10-May-1946, 72 y.o.   MRN: [id number] CC: Edema (Left ankle)  HPI: Anthony Wise. is a 72 y.o. male presenting for Edema (Left ankle)  Left ankle slightly swollen starting about 4 days ago.  He has been on his feet slightly more than usual.  No pain in the ankle.  No known injury he has had some thickened skin and redness on the medial side of his ankle for months.  This was more red about a year ago, tender at that time, treated for cellulitis with improvement in symptoms.  He stays active throughout the day.  No shortness of breath, no chest pain.  Feels like he has good exercise tolerance.  Has not changed recently.  Smokes daily, says he is trying to quit, not interested at this time.  Relevant past medical, surgical, family and social history reviewed. Allergies and medications reviewed and updated. Social History   Tobacco Use  Smoking Status Current Every Day Smoker  . Packs/day: 0.50  . Years: 50.00  . Pack years: 25.00  . Types: Cigarettes  Smokeless Tobacco Never Used   ROS: Per HPI   Objective:    BP 132/68   Pulse 71   Temp (!) 96.5 F (35.8 C) (Oral)   Ht '6\' 3"'$  (1.905 m)   Wt 174 lb 6.4 oz (79.1 kg)   BMI 21.80 kg/m   Wt Readings from Last 3 Encounters:  04/02/18 174 lb 6.4 oz (79.1 kg)  04/17/17 162 lb (73.5 kg)  02/15/15 167 lb (75.8 kg)    Gen: NAD, alert, cooperative with exam, NCAT EYES: EOMI, no conjunctival injection, or no icterus CV: NRRR, normal S1/S2, no murmur, DP pulses present bilaterally.  Resp: CTABL, no wheezes, normal WOB Neuro: Alert and oriented, strength equal b/l UE and LE, coordination grossly normal MSK: Left ankle slightly more swollen, nonpitting edema, compared to right ankle.  Normal range of motion.  No pain with range of motion.  Slightly tender at top of left foot arch with palpation. Skin: Hyperkeratosis, some flaps of white thick dry skin medial left ankle.   Slightly pink medial right ankle.  Varicose veins present.  Skin is intact.  No ulceration, no discharge.  No tenderness to palpation.  Assessment & Plan:  Anthony Wise was seen today for edema.  Diagnoses and all orders for this visit:  Leg swelling Gets worse when he is on his feet, improved by morning.  Does have area of very dry skin left ankle.  Will start with thick moisturizer.  No pain.  Low suspicion for infection or blood clot as patient has remained very active.  Return precautions discussed. -     CMP14+EGFR -     CBC with Differential/Platelet  Hyperlipidemia, unspecified hyperlipidemia type -     Lipid panel  Tobacco use Encouraged cessation.  Follow up plan: Return in about 1 month (around 04/30/2018). Assunta Found, MD [place]

## 2018-04-02 NOTE — Patient Instructions (Signed)
Use thick white moisturizer such as CeraVe, Eucerin, Cetaphil twice a day on the skin of your lower legs to help with dry skin areas.  Any pain, worsening swelling let me know

## 2018-04-03 LAB — CBC WITH DIFFERENTIAL/PLATELET
BASOS ABS: 0 10*3/uL (ref 0.0–0.2)
Basos: 1 %
EOS (ABSOLUTE): 0.4 10*3/uL (ref 0.0–0.4)
Eos: 5 %
HEMOGLOBIN: 13.6 g/dL (ref 13.0–17.7)
Hematocrit: 39.4 % (ref 37.5–51.0)
Immature Grans (Abs): 0 10*3/uL (ref 0.0–0.1)
Immature Granulocytes: 1 %
LYMPHS ABS: 2 10*3/uL (ref 0.7–3.1)
Lymphs: 30 %
MCH: 30 pg (ref 26.6–33.0)
MCHC: 34.5 g/dL (ref 31.5–35.7)
MCV: 87 fL (ref 79–97)
MONOS ABS: 0.6 10*3/uL (ref 0.1–0.9)
Monocytes: 9 %
Neutrophils Absolute: 3.7 10*3/uL (ref 1.4–7.0)
Neutrophils: 54 %
Platelets: 305 10*3/uL (ref 150–379)
RBC: 4.54 x10E6/uL (ref 4.14–5.80)
RDW: 14.5 % (ref 12.3–15.4)
WBC: 6.6 10*3/uL (ref 3.4–10.8)

## 2018-04-03 LAB — CMP14+EGFR
A/G RATIO: 1.2 (ref 1.2–2.2)
ALBUMIN: 4 g/dL (ref 3.5–4.8)
ALT: 5 IU/L (ref 0–44)
AST: 16 IU/L (ref 0–40)
Alkaline Phosphatase: 116 IU/L (ref 39–117)
BILIRUBIN TOTAL: 0.4 mg/dL (ref 0.0–1.2)
BUN / CREAT RATIO: 14 (ref 10–24)
BUN: 9 mg/dL (ref 8–27)
CHLORIDE: 100 mmol/L (ref 96–106)
CO2: 22 mmol/L (ref 20–29)
Calcium: 8.9 mg/dL (ref 8.6–10.2)
Creatinine, Ser: 0.65 mg/dL — ABNORMAL LOW (ref 0.76–1.27)
GFR calc Af Amer: 113 mL/min/{1.73_m2} (ref >59)
GFR calc non Af Amer: 98 mL/min/{1.73_m2} (ref >59)
GLOBULIN, TOTAL: 3.3 g/dL (ref 1.5–4.5)
Glucose: 120 mg/dL — ABNORMAL HIGH (ref 65–99)
POTASSIUM: 4.2 mmol/L (ref 3.5–5.2)
SODIUM: 137 mmol/L (ref 134–144)
Total Protein: 7.3 g/dL (ref 6.0–8.5)

## 2018-04-03 LAB — HEPATITIS C ANTIBODY

## 2018-04-03 LAB — LIPID PANEL
CHOLESTEROL TOTAL: 145 mg/dL (ref 100–199)
Chol/HDL Ratio: 4.4 ratio (ref 0.0–5.0)
HDL: 33 mg/dL — ABNORMAL LOW (ref >39)
LDL CALC: 92 mg/dL (ref 0–99)
TRIGLYCERIDES: 101 mg/dL (ref 0–149)
VLDL Cholesterol Cal: 20 mg/dL (ref 5–40)

## 2018-04-09 LAB — HGB A1C W/O EAG: Hgb A1c MFr Bld: 5.6 % (ref 4.8–5.6)

## 2018-04-09 LAB — SPECIMEN STATUS REPORT

## 2018-04-10 ENCOUNTER — Ambulatory Visit: Payer: Medicare Other

## 2018-04-21 ENCOUNTER — Ambulatory Visit (INDEPENDENT_AMBULATORY_CARE_PROVIDER_SITE_OTHER): Payer: Medicare Other | Admitting: *Deleted

## 2018-04-21 VITALS — BP 125/66 | HR 62 | Ht 75.0 in | Wt 173.2 lb

## 2018-04-21 DIAGNOSIS — Z87891 Personal history of nicotine dependence: Secondary | ICD-10-CM

## 2018-04-21 DIAGNOSIS — Z0000 Encounter for general adult medical examination without abnormal findings: Secondary | ICD-10-CM

## 2018-04-21 NOTE — Patient Instructions (Signed)
  Anthony Wise , Thank you for taking time to come for your Medicare Wellness Visit. I appreciate your ongoing commitment to your health goals. Please review the following plan we discussed and let me know if I can assist you in the future.   These are the goals we discussed: Goals    . Exercise 3x per week (30 min per time)       This is a list of the screening recommended for you and due dates:  Health Maintenance  Topic Date Due  . Colon Cancer Screening  11/13/1996  . Pneumonia vaccines (2 of 2 - PCV13) 01/22/2015  . Flu Shot  07/30/2018  . Tetanus Vaccine  04/02/2022  .  Hepatitis C: One time screening is recommended by Center for Disease Control  (CDC) for  adults born from 50 through 1965.   Completed   Please check on location of colonoscopy and let us know  We will call you with CT scan appointment  We will administer prevnar at your visit with Dr. Evette Doffing.

## 2018-04-21 NOTE — Progress Notes (Addendum)
Subjective:   Anthony Wise. is a 72 y.o. male who presents for an Initial Medicare Annual Wellness Visit. Anthony Wise worked for the [employer] for 28 years before retiring. He states that he does not really have any hobbies since retiring but he does enjoy reading at times. He states he does walk twice a week for about 30 minutes. He states his diet is semi-healthy. He lives with his wife in [place] and they have 2 adult daughters. He has 2 dogs and 1 cat in the home. He has 3 sets of stairs in the home and also has throw rugs which has the slip resistance bottom. Fall hazards were discussed with patient. Patient has not had any hospitalizations or surgeries in the last year.  Review of Systems  Patient does complain with continued left ankle pain and swelling. Patient states he will follow up with Dr. Evette Doffing at next appointment.   Cardiac Risk Factors include: advanced age (>26men, >56 women);male gender;smoking/ tobacco exposure    Objective:    Today's Vitals   04/21/18 0831  BP: (!) 142/76  Pulse: 62  Weight: 173 lb 3.2 oz (78.6 kg)  Height: 6\' 3"  (1.905 m)  BP 125/66   Pulse 62   Ht 6\' 3"  (1.905 m)   Wt 173 lb 3.2 oz (78.6 kg)   BMI 21.65 kg/m   Body mass index is 21.65 kg/m.  Advanced Directives 04/21/2018 02/10/2015 12/20/2014 01/21/2014 01/20/2014  Does Patient Have a Medical Advance Directive? No No No Patient does not have advance directive Patient does not have advance directive;Patient would like information  Would patient like information on creating a medical advance directive? Yes (MAU/Ambulatory/Procedural Areas - Information given) No - patient declined information No - patient declined information Advance directive packet given Advance directive packet given  Pre-existing out of facility DNR order (yellow form or pink MOST form) - - - No -    Current Medications (verified) No outpatient encounter medications on file as of 04/21/2018.   No  facility-administered encounter medications on file as of 04/21/2018.     Allergies (verified) Patient has no known allergies.   History: Past Medical History:  Diagnosis Date  . Blood pressure elevated 01/20/14   PT'S B/P ELEVATED TODAY   - PT STATES HIS B/P OK WHEN HE CHECKS AT HOME - HE IS ANXIOUS ABOUT HAVING SURGERY.  . Rash    notified doctor on 02/14/15   Past Surgical History:  Procedure Laterality Date  . INGUINAL HERNIA REPAIR Left 01/21/2014   Procedure: OPEN LEFT INGUNIAL HERNIA REPAIR;  Surgeon: Shann Medal, MD;  Location: [facility];  Service: General;  Laterality: Left;  with Mesh  . INGUINAL HERNIA REPAIR Left 02/15/2015   Procedure: LAPAROSCOPIC LEFT  INGUINAL HERNIA REPAIR;  Surgeon: Alphonsa Overall, MD;  Location: [facility];  Service: General;  Laterality: Left;  . INSERTION OF MESH Left 02/15/2015   Procedure: INSERTION OF MESH;  Surgeon: Alphonsa Overall, MD;  Location: [facility];  Service: General;  Laterality: Left;  . KNEE ARTHROSCOPY    . TONSILLECTOMY     AS A CHILD   Family History  Problem Relation Age of Onset  . Diabetes Mother   . Stroke Mother   . Diabetes Sister    Social History   Socioeconomic History  . Marital status: Married    Spouse name: Bonnita Nasuti  . Number of children: 2  . Years of education: Not on file  . Highest education  level: Some college, no degree  Occupational History  . Not on file  Social Needs  . Financial resource strain: Not hard at all  . Food insecurity:    Worry: Never true    Inability: Never true  . Transportation needs:    Medical: No    Non-medical: No  Tobacco Use  . Smoking status: Current Every Day Smoker    Packs/day: 0.50    Years: 50.00    Pack years: 25.00    Types: Cigarettes  . Smokeless tobacco: Never Used  Substance and Sexual Activity  . Alcohol use: Not Currently    Alcohol/week: 0.0 oz    Comment: Has not had a drink in 3 years  . Drug use: Yes    Types: Marijuana  . Sexual activity: Not Currently    Lifestyle  . Physical activity:    Days per week: 2 days    Minutes per session: 30 min  . Stress: Not at all  Relationships  . Social connections:    Talks on phone: More than three times a week    Gets together: More than three times a week    Attends religious service: Never    Active member of club or organization: No    Attends meetings of clubs or organizations: Never    Relationship status: Married  Other Topics Concern  . Not on file  Social History Narrative  . Not on file   Tobacco Counseling Ready to quit: Not Answered Counseling given: Not Answered Patient is not ready to quit smoking at this time   Clinical Intake:  BMI - recorded: 21.65 Nutritional Status: BMI of 19-24  Normal Diabetes: No  Activities of Daily Living: Independent Ambulation: Independent Medication Administration: Independent Home Management: Independent  Barriers to Care Management & Learning: None  Do you feel unsafe in your current relationship?: No Do you feel physically threatened by others?: No Anyone hurting you at home, work, or school?: No Unable to ask?: No  How often do you need to have someone help you when you read instructions, pamphlets, or other written materials from your doctor or pharmacy?: 1 - Never  Interpreter Needed?: No     Activities of Daily Living In your present state of health, do you have any difficulty performing the following activities: 04/21/2018  Hearing? N  Vision? N  Difficulty concentrating or making decisions? N  Walking or climbing stairs? Y  Comment Yes right now due to ankle pain   Dressing or bathing? N  Doing errands, shopping? N  Preparing Food and eating ? N  Using the Toilet? N  In the past six months, have you accidently leaked urine? N  Do you have problems with loss of bowel control? N  Managing your Medications? N  Managing your Finances? N  Housekeeping or managing your Housekeeping? N  Some recent data might be hidden   Patient states that he is having some trouble walking and climbing stairs at this time due to left ankle pain and swelling.    Immunizations and Health Maintenance Immunization History  Administered Date(s) Administered  . Influenza, High Dose Seasonal PF 10/15/2017  . Influenza,inj,Quad PF,6+ Mos 01/22/2014, 12/06/2014  . Pneumococcal Polysaccharide-23 01/22/2014   Health Maintenance Due  Topic Date Due  . COLONOSCOPY  11/13/1996  . PNA vac Low Risk Adult (2 of 2 - PCV13) 01/22/2015  Patient wanted to wain on the prevnar today and states he will get at his next appointment.  Patient  states that he had colonoscopy 3 years ago and he will let us know the location that this was completed at.   Patient Care Team: Eustaquio Maize, MD as PCP - General (Pediatrics)  Indicate any recent Medical Services you may have received from other than [facility] providers in the past year (date may be approximate).    Assessment:   This is a routine wellness examination for Olusegun.  Hearing/Vision screen No exam data present  Dietary issues and exercise activities discussed: Current Exercise Habits: Home exercise routine, Type of exercise: walking, Time (Minutes): 30, Frequency (Times/Week): 2, Weekly Exercise (Minutes/Week): 60, Intensity: Mild  Goals    . Exercise 3x per week (30 min per time)      Depression Screen PHQ 2/9 Scores 04/21/2018 04/02/2018 04/17/2017 12/06/2014  PHQ - 2 Score 0 0 0 0    Fall Risk Fall Risk  04/21/2018 04/02/2018 04/17/2017 12/06/2014  Falls in the past year? No No No No    Is the patient's home free of loose throw rugs in walkways, pet beds, electrical cords, etc?   yes      Grab bars in the bathroom? no      Handrails on the stairs?   yes      Adequate lighting?   yes  Timed Get Up and Go performed:   Cognitive Function: MMSE - Mini Mental State Exam 04/21/2018  Orientation to time 5  Orientation to Place 5  Registration 3  Attention/ Calculation 5  Recall 3   Language- name 2 objects 2  Language- repeat 1  Language- follow 3 step command 3  Language- read & follow direction 1  Write a sentence 1  Copy design 1  Total score 30  Patient scored a 30 out of 30      Screening Tests Health Maintenance  Topic Date Due  . COLONOSCOPY  11/13/1996  . PNA vac Low Risk Adult (2 of 2 - PCV13) 01/22/2015  . INFLUENZA VACCINE  07/30/2018  . TETANUS/TDAP  04/02/2022  . Hepatitis C Screening  Completed    Qualifies for Shingles Vaccine?Yes   Cancer Screenings: Lung: Low Dose CT Chest recommended if Age 51-80 years, 30 pack-year currently smoking OR have quit w/in 15years. Patient does qualify. Colorectal: Patient will let us know where this was completed. Patient states he had about 3 years ago.  Additional Screenings:  Hepatitis C Screening: Completed       Plan:  Patient will let us know where colonoscopy was performed. We will administer prevnar at visit with Dr. Evette Doffing. Patient did not want today.  Low dose CT scan will be ordered and we will contact patient with appointment.  I have personally reviewed and noted the following in the patient's chart:   . Medical and social history . Use of alcohol, tobacco or illicit drugs  . Current medications and supplements . Functional ability and status . Nutritional status . Physical activity . Advanced directives . List of other physicians . Hospitalizations, surgeries, and ER visits in previous 12 months . Vitals . Screenings to include cognitive, depression, and falls . Referrals and appointments  In addition, I have reviewed and discussed with patient certain preventive protocols, quality metrics, and best practice recommendations. A written personalized care plan for preventive services as well as general preventive health recommendations were provided to patient.     Gareth Morgan, LPN   6/64/4034    I have reviewed and agree with the above AWV documentation.  Claretta Fraise, M.D.

## 2018-05-08 ENCOUNTER — Ambulatory Visit (HOSPITAL_COMMUNITY): Admission: RE | Admit: 2018-05-08 | Payer: Medicare Other | Source: Ambulatory Visit

## 2018-05-08 ENCOUNTER — Ambulatory Visit (HOSPITAL_COMMUNITY)
Admission: RE | Admit: 2018-05-08 | Discharge: 2018-05-08 | Disposition: A | Discharge status: Home / Self Care | Payer: Medicare Other | Source: Ambulatory Visit | Attending: Pediatrics | Admitting: Pediatrics

## 2018-05-08 ENCOUNTER — Other Ambulatory Visit: Payer: Self-pay | Admitting: Pediatrics

## 2018-05-08 ENCOUNTER — Other Ambulatory Visit: Payer: Self-pay | Admitting: *Deleted

## 2018-05-08 DIAGNOSIS — F17200 Nicotine dependence, unspecified, uncomplicated: Secondary | ICD-10-CM

## 2018-05-08 DIAGNOSIS — R05 Cough: Secondary | ICD-10-CM

## 2018-05-08 DIAGNOSIS — J432 Centrilobular emphysema: Secondary | ICD-10-CM | POA: Insufficient documentation

## 2018-05-08 DIAGNOSIS — R059 Cough, unspecified: Secondary | ICD-10-CM

## 2018-05-08 DIAGNOSIS — I700 Atherosclerosis of aorta: Secondary | ICD-10-CM | POA: Insufficient documentation

## 2018-05-08 DIAGNOSIS — Z122 Encounter for screening for malignant neoplasm of respiratory organs: Secondary | ICD-10-CM

## 2018-05-08 DIAGNOSIS — I712 Thoracic aortic aneurysm, without rupture: Secondary | ICD-10-CM | POA: Insufficient documentation

## 2018-05-08 DIAGNOSIS — J439 Emphysema, unspecified: Secondary | ICD-10-CM | POA: Diagnosis not present

## 2018-05-08 DIAGNOSIS — R918 Other nonspecific abnormal finding of lung field: Secondary | ICD-10-CM | POA: Insufficient documentation

## 2018-05-08 DIAGNOSIS — Z87891 Personal history of nicotine dependence: Secondary | ICD-10-CM

## 2018-05-08 NOTE — Progress Notes (Signed)
Received phone call from [facility] stating that patient does not qualify for CT check lung cancer screening.  CT without contrast ordered.

## 2018-05-11 ENCOUNTER — Encounter: Payer: Self-pay | Admitting: Pediatrics

## 2018-05-11 ENCOUNTER — Ambulatory Visit (INDEPENDENT_AMBULATORY_CARE_PROVIDER_SITE_OTHER): Payer: Medicare Other | Admitting: Pediatrics

## 2018-05-11 VITALS — BP 132/67 | HR 73 | Temp 98.2°F | Ht 75.0 in | Wt 176.4 lb

## 2018-05-11 DIAGNOSIS — Z720 Tobacco use: Secondary | ICD-10-CM

## 2018-05-11 DIAGNOSIS — I77810 Thoracic aortic ectasia: Secondary | ICD-10-CM | POA: Diagnosis not present

## 2018-05-11 DIAGNOSIS — R918 Other nonspecific abnormal finding of lung field: Secondary | ICD-10-CM

## 2018-05-11 DIAGNOSIS — L03116 Cellulitis of left lower limb: Secondary | ICD-10-CM | POA: Diagnosis not present

## 2018-05-11 MED ORDER — CEPHALEXIN 500 MG PO CAPS: 500.0000 mg | ORAL_CAPSULE | Freq: Four times a day (QID) | ORAL | 0 refills | Status: DC

## 2018-05-11 NOTE — Progress Notes (Signed)
  Subjective:   Patient ID: Anthony Wise., male    DOB: 04/20/1946, 72 y.o.   MRN: [id number] CC: Leg Swelling (Left ankle)  HPI: Anthony Wise. is a 72 y.o. male   Left ankle has been slightly swollen off and on for several years.  Red area on the inside of the left ankle he thinks is slightly worse.  Sometimes has tenderness in the area.  No pain with walking.  No fevers.  Normal appetite.  Recent CT scan with pulmonary nodules up to 5 mm in left upper lobe that will need follow-up one year.  Also with fusiform ascending aortic aneurysm measuring up to 4.4 cm.  Recommended follow-up in 1 year.  Current every day smoker.  Open to cutting back.  Smoking about half pack a day right now.  Relevant past medical, surgical, family and social history reviewed. Allergies and medications reviewed and updated. Social History   Tobacco Use  Smoking Status Current Every Day Smoker  . Packs/day: 0.75  . Years: 50.00  . Pack years: 37.50  . Types: Cigarettes  Smokeless Tobacco Never Used   ROS: Per HPI   Objective:    BP 132/67   Pulse 73   Temp 98.2 F (36.8 C) (Oral)   Ht 6\' 3"  (1.905 m)   Wt 176 lb 6.4 oz (80 kg)   BMI 22.05 kg/m   Wt Readings from Last 3 Encounters:  05/11/18 176 lb 6.4 oz (80 kg)  04/21/18 173 lb 3.2 oz (78.6 kg)  04/02/18 174 lb 6.4 oz (79.1 kg)    Gen: NAD, alert, cooperative with exam, NCAT EYES: EOMI, no conjunctival injection, or no icterus CV: NRRR, normal S1/S2 Resp: CTABL, no wheezes, normal WOB Ext: Trace pitting edema left ankle.  Neuro: Alert and oriented Skin: Approximately 5 cm patch of red, slightly peeling skin left medial ankle.  No drainage.  Slightly tender with palpation.  Assessment & Plan:  Anthony Wise was seen today for leg swelling.  Diagnoses and all orders for this visit:  Cellulitis of left lower extremity Increased pain and redness left medial ankle.  Will treat with below.  If not improving let me know. -      cephALEXin (KEFLEX) 500 MG capsule; Take 1 capsule (500 mg total) by mouth 4 (four) times daily.  Ascending aorta dilatation [facility]) Discussed CT scan results with patient.  Will need repeat imaging in 1 year  Pulmonary nodules Discussed CT scan results with patient.  Will need repeat imaging in 1 year  Tobacco use Patient more open to cutting back with a goal of cessation, discussed strategies.  Follow up plan: Return in about 6 months (around 11/11/2018). Assunta Found, MD [place]

## 2018-05-11 NOTE — Patient Instructions (Signed)
Continue to work on decreasing cigarettes  Start Keflex to treat infection in left ankle.  If not improving we will get you into see Dr. Irving Shows or vascular surgery.  You will need a follow-up CT scan to follow-up your aorta and small lung nodules in 12 months.

## 2018-05-16 DIAGNOSIS — I77810 Thoracic aortic ectasia: Secondary | ICD-10-CM | POA: Insufficient documentation

## 2018-05-16 DIAGNOSIS — R918 Other nonspecific abnormal finding of lung field: Secondary | ICD-10-CM | POA: Insufficient documentation

## 2018-05-20 ENCOUNTER — Other Ambulatory Visit: Payer: Self-pay | Admitting: Pediatrics

## 2018-05-20 DIAGNOSIS — L03116 Cellulitis of left lower limb: Secondary | ICD-10-CM

## 2018-11-11 ENCOUNTER — Encounter: Payer: Self-pay | Admitting: Pediatrics

## 2018-11-11 ENCOUNTER — Ambulatory Visit (INDEPENDENT_AMBULATORY_CARE_PROVIDER_SITE_OTHER): Payer: Medicare Other | Admitting: Pediatrics

## 2018-11-11 VITALS — BP 136/70 | HR 69 | Temp 97.0°F | Ht 75.0 in | Wt 179.0 lb

## 2018-11-11 DIAGNOSIS — I77810 Thoracic aortic ectasia: Secondary | ICD-10-CM | POA: Diagnosis not present

## 2018-11-11 DIAGNOSIS — R6889 Other general symptoms and signs: Secondary | ICD-10-CM | POA: Diagnosis not present

## 2018-11-11 DIAGNOSIS — I2510 Atherosclerotic heart disease of native coronary artery without angina pectoris: Secondary | ICD-10-CM | POA: Diagnosis not present

## 2018-11-11 DIAGNOSIS — R918 Other nonspecific abnormal finding of lung field: Secondary | ICD-10-CM

## 2018-11-11 DIAGNOSIS — I2584 Coronary atherosclerosis due to calcified coronary lesion: Secondary | ICD-10-CM

## 2018-11-11 DIAGNOSIS — I7090 Unspecified atherosclerosis: Secondary | ICD-10-CM | POA: Diagnosis not present

## 2018-11-11 DIAGNOSIS — Z23 Encounter for immunization: Secondary | ICD-10-CM | POA: Diagnosis not present

## 2018-11-11 NOTE — Progress Notes (Signed)
  Subjective:   Patient ID: Anthony Rossetti., male    DOB: [birth year], 72 y.o.   MRN: [id number] CC: Medical Management of Chronic Issues  HPI: Anthony Cwynar. is a 72 y.o. male   Lung nodules: Due for repeat CT scan 04/2019  Aortic aneurysm: Due for repeat imaging 04/2019  Coronary artery disease: Seen on CT scan.  Patient with no symptoms.  Not currently taking any medicines.  He never has shortness of breath or chest pain with exertion.  He walks some, 20 minutes without any problems.  Recent slow healing left ankle sore.  Still with some crusting in the area.  No tenderness, does not bother him.  Treated 6 months ago for cellulitis and area.  Relevant past medical, surgical, family and social history reviewed. Allergies and medications reviewed and updated. Social History   Tobacco Use  Smoking Status Current Every Day Smoker  . Packs/day: 0.75  . Years: 50.00  . Pack years: 37.50  . Types: Cigarettes  Smokeless Tobacco Never Used   ROS: Per HPI   Objective:    BP 136/70   Pulse 69   Temp (!) 97 F (36.1 C) (Oral)   Ht 6\' 3"  (1.905 m)   Wt 179 lb (81.2 kg)   BMI 22.37 kg/m   Wt Readings from Last 3 Encounters:  11/11/18 179 lb (81.2 kg)  05/11/18 176 lb 6.4 oz (80 kg)  04/21/18 173 lb 3.2 oz (78.6 kg)    Gen: NAD, alert, cooperative with exam, NCAT EYES: EOMI, no conjunctival injection, or no icterus ENT: OP without erythema LYMPH: no cervical LAD CV: NRRR, normal S1/S2, no murmur, DP pulse 1+ L foot Resp: CTABL, no wheezes, normal WOB Ext: No edema, warm Neuro: Alert and oriented, strength equal b/l UE and LE, coordination grossly normal MSK: normal muscle bulk Skin: Right medial heel with approximately 1.5 cm slightly peeling hyperpigmented plaque.  No surrounding redness or tenderness.  Assessment & Plan:  Anthony Wise was seen today for medical management of chronic issues.  Diagnoses and all orders for this visit:  Coronary artery disease due to  calcified coronary lesion ASCVD score 24% based on labs from 6 mo ago. Discussed treatment options, recommended starting statin. Pt declines medicine for now, wants to stay off of medicine, work on smoking cessation.  Ascending aorta dilatation (HCC) Pulmonary nodules Will need repeat imaging 04/2019  Atherosclerosis ABIs positive for likely PAD left foot, not able to get reading.  Normal right foot.  Will refer to vascular surgery.  Tobacco use Discussed cessation strategies.  Discussed treatment options, patient declines medicines.  Follow up plan: Return in about 6 months (around 05/12/2019). Assunta Found, MD [place]

## 2018-11-11 NOTE — Patient Instructions (Signed)
[phone] QUIT NOW   

## 2018-12-07 ENCOUNTER — Encounter: Payer: Self-pay | Admitting: Vascular Surgery

## 2018-12-07 ENCOUNTER — Ambulatory Visit (INDEPENDENT_AMBULATORY_CARE_PROVIDER_SITE_OTHER): Payer: Medicare Other | Admitting: Vascular Surgery

## 2018-12-07 VITALS — BP 146/71 | HR 63 | Temp 97.7°F | Resp 18 | Ht 75.0 in | Wt 180.6 lb

## 2018-12-07 DIAGNOSIS — I2510 Atherosclerotic heart disease of native coronary artery without angina pectoris: Secondary | ICD-10-CM | POA: Diagnosis not present

## 2018-12-07 DIAGNOSIS — I739 Peripheral vascular disease, unspecified: Secondary | ICD-10-CM | POA: Diagnosis not present

## 2018-12-07 DIAGNOSIS — I2584 Coronary atherosclerosis due to calcified coronary lesion: Secondary | ICD-10-CM

## 2018-12-07 NOTE — Progress Notes (Signed)
[facility] [place]  Patient name: Anthony Wise. MRN: [id number] DOB: 14-Nov-1946 Sex: male  REASON FOR CONSULT: Evaluation of peripheral vascular disease left lower extremity  Seen today in our [place] office  HPI: Anthony Wise. is a 72 y.o. male, who is here today with his wife for discussion of lower extremity arterial insufficiency left leg.  He had concern regarding some redness and excoriation on the medial aspect of his left ankle.  He underwent a screening study at [facility] suggesting arterial insufficiency on the left.  He does have very clear-cut left calf claudication symptoms.  He reports that this is retired and is not particularly limiting to him.  He is able to tolerate this level of claudication.  He has had no open ulceration and does have the area of thickening on the medial aspect.  Is not report any significant swelling in his left leg versus his right.  Cigarette smoker.  Past Medical History:  Diagnosis Date  . Blood pressure elevated 01/20/14   PT'S B/P ELEVATED TODAY   - PT STATES HIS B/P OK WHEN HE CHECKS AT HOME - HE IS ANXIOUS ABOUT HAVING SURGERY.  . Rash    notified doctor on 02/14/15    Family History  Problem Relation Age of Onset  . Diabetes Mother   . Stroke Mother   . Diabetes Sister     SOCIAL HISTORY: Social History   Socioeconomic History  . Marital status: Married    Spouse name: Bonnita Nasuti  . Number of children: 2  . Years of education: Not on file  . Highest education level: Some college, no degree  Occupational History  . Not on file  Social Needs  . Financial resource strain: Not hard at all  . Food insecurity:    Worry: Never true    Inability: Never true  . Transportation needs:    Medical: No    Non-medical: No  Tobacco Use  . Smoking status: Current Every Day Smoker    Packs/day: 0.75    Years: 50.00    Pack years: 37.50   Types: Cigarettes  . Smokeless tobacco: Never Used  Substance and Sexual Activity  . Alcohol use: Not Currently    Comment: Has not had a drink in 3 years  . Drug use: Yes    Types: Marijuana  . Sexual activity: Not Currently  Lifestyle  . Physical activity:    Days per week: 2 days    Minutes per session: 30 min  . Stress: Not at all  Relationships  . Social connections:    Talks on phone: More than three times a week    Gets together: More than three times a week    Attends religious service: Never    Active member of club or organization: No    Attends meetings of clubs or organizations: Never    Relationship status: Married  . Intimate partner violence:    Fear of current or ex partner: No    Emotionally abused: No    Physically abused: No    Forced sexual activity: No  Other Topics Concern  . Not on file  Social History Narrative  . Not on file    No Known Allergies  No current outpatient medications on file.   No current facility-administered medications for this visit.     REVIEW OF SYSTEMS:  [X]  denotes positive finding, [ ]  denotes negative finding Cardiac  Comments:  Chest pain or chest pressure:    Shortness of breath upon exertion:    Short of breath when lying flat:    Irregular heart rhythm:        Vascular    Pain in calf, thigh, or hip brought on by ambulation: x   Pain in feet at night that wakes you up from your sleep:     Blood clot in your veins:    Leg swelling:         Pulmonary    Oxygen at home:    Productive cough:     Wheezing:         Neurologic    Sudden weakness in arms or legs:     Sudden numbness in arms or legs:     Sudden onset of difficulty speaking or slurred speech:    Temporary loss of vision in one eye:     Problems with dizziness:         Gastrointestinal    Blood in stool:     Vomited blood:         Genitourinary    Burning when urinating:     Blood in urine:        Psychiatric    Major depression:          Hematologic    Bleeding problems:    Problems with blood clotting too easily:        Skin    Rashes or ulcers:        Constitutional    Fever or chills:      PHYSICAL EXAM: Vitals:   12/07/18 0945  BP: (!) 146/71  Pulse: 63  Resp: 18  Temp: 97.7 F (36.5 C)  TempSrc: Temporal  Weight: 180 lb 9.6 oz (81.9 kg)  Height: 6\' 3"  (1.905 m)    GENERAL: The patient is a well-nourished male, in no acute distress. The vital signs are documented above. CARDIOVASCULAR: Carotid arteries without bruits bilaterally.  2+ radial and 2+ femoral pulses bilaterally.  2+ right popliteal and 2+ right posterior tibial pulse.  Diminished left popliteal pulse and absent pulses in the left foot. PULMONARY: There is good air exchange  ABDOMEN: Soft and non-tender  MUSCULOSKELETAL: There are no major deformities or cyanosis. NEUROLOGIC: No focal weakness or paresthesias are detected. SKIN: He does have some thickening in his left medial ankle skin with mild erythema.  No open ulceration.  Does have telangiectasia over both lower extremities more so on the left than on the right PSYCHIATRIC: The patient has a normal affect.  DATA:  I reviewed his screening study suggesting dampening in the waveforms on the left leg with normal flow on the right  Hand-held Doppler today revealed normal Doppler signal in the right ankle.  More dampened on the left ankle but audible and also audible at the dorsalis pedis level.  MEDICAL ISSUES: I discussed these findings with the patient and his wife.  I explained that his calf discomfort is clearly related to arterial insufficiency with typical intermittent claudication.  I do not feel that the area of concern on his medial aspect is related to arterial insufficiency.  This may be fungal.  Does have thickening and cracking in this area.  Could also be related to venous insufficiency but he is reporting no specific swelling.  I did stress to the patient and his wife the  critical importance of smoking cessation to reduce the progression of his arterial insufficiency.  Also explained the importance  of a walking program.  I did also discuss symptoms of critical ischemia and he will notify us immediately if this occur.  Otherwise we will see him again in 6 months for continued discussion noninvasive studies at that time   Rosetta Posner, MD [facility] Tel [phone] Pager [phone]

## 2019-05-12 ENCOUNTER — Other Ambulatory Visit: Payer: Self-pay

## 2019-05-12 ENCOUNTER — Ambulatory Visit (INDEPENDENT_AMBULATORY_CARE_PROVIDER_SITE_OTHER): Payer: Medicare Other | Admitting: Family Medicine

## 2019-05-12 ENCOUNTER — Encounter: Payer: Self-pay | Admitting: Family Medicine

## 2019-05-12 DIAGNOSIS — I700 Atherosclerosis of aorta: Secondary | ICD-10-CM | POA: Diagnosis not present

## 2019-05-12 DIAGNOSIS — R918 Other nonspecific abnormal finding of lung field: Secondary | ICD-10-CM | POA: Diagnosis not present

## 2019-05-12 DIAGNOSIS — I77810 Thoracic aortic ectasia: Secondary | ICD-10-CM

## 2019-05-12 NOTE — Progress Notes (Signed)
Virtual Visit via telephone Note Due to COVID-19, visit is conducted virtually and was requested by patient. This visit type was conducted due to national recommendations for restrictions regarding the COVID-19 Pandemic (e.g. social distancing) in an effort to limit this patient's exposure and mitigate transmission in our community. All issues noted in this document were discussed and addressed.  A physical exam was not performed with this format.   I connected with Anthony Rossetti. on 05/12/19 at [phone] by telephone and verified that I am speaking with the correct person using two identifiers. Anthony Rossetti. is currently located at home and family is currently with them during visit. The provider, Monia Pouch, FNP is located in their office at time of visit.  I discussed the limitations, risks, security and privacy concerns of performing an evaluation and management service by telephone and the availability of in person appointments. I also discussed with the patient that there may be a patient responsible charge related to this service. The patient expressed understanding and agreed to proceed.  Subjective:  Patient ID: Anthony Rossetti., male    DOB: 1946-07-28, 73 y.o.   MRN: [id number]  Chief Complaint:  No chief complaint on file.   HPI: Anthony Sudano. is a 73 y.o. male presenting on 05/12/2019 for No chief complaint on file.   Pt for follow up of pulmonary nodules and aortic dilatation. Pt states he is doing fine. He still smokes 1/2 PPD and does not desire to quit. He denies chest pain, shortness of breath, weight loss, fever, chills, night sweats, palpitations, weakness, confusion, dizziness, or headaches. No numbness or tingling in extremities. Denies cold intolerance. He does not take any medications and does not wish to start.     Relevant past medical, surgical, family, and social history reviewed and updated as indicated.  Allergies and medications reviewed and  updated.   Past Medical History:  Diagnosis Date  . Blood pressure elevated 01/20/14   PT'S B/P ELEVATED TODAY   - PT STATES HIS B/P OK WHEN HE CHECKS AT HOME - HE IS ANXIOUS ABOUT HAVING SURGERY.  . Rash    notified doctor on 02/14/15    Past Surgical History:  Procedure Laterality Date  . INGUINAL HERNIA REPAIR Left 01/21/2014   Procedure: OPEN LEFT INGUNIAL HERNIA REPAIR;  Surgeon: Shann Medal, MD;  Location: [facility];  Service: General;  Laterality: Left;  with Mesh  . INGUINAL HERNIA REPAIR Left 02/15/2015   Procedure: LAPAROSCOPIC LEFT  INGUINAL HERNIA REPAIR;  Surgeon: Alphonsa Overall, MD;  Location: [facility];  Service: General;  Laterality: Left;  . INSERTION OF MESH Left 02/15/2015   Procedure: INSERTION OF MESH;  Surgeon: Alphonsa Overall, MD;  Location: [facility];  Service: General;  Laterality: Left;  . KNEE ARTHROSCOPY    . TONSILLECTOMY     AS A CHILD    Social History   Socioeconomic History  . Marital status: Married    Spouse name: Bonnita Nasuti  . Number of children: 2  . Years of education: Not on file  . Highest education level: Some college, no degree  Occupational History  . Not on file  Social Needs  . Financial resource strain: Not hard at all  . Food insecurity:    Worry: Never true    Inability: Never true  . Transportation needs:    Medical: No    Non-medical: No  Tobacco Use  . Smoking status: Current Every Day Smoker  Packs/day: 0.75    Years: 50.00    Pack years: 37.50    Types: Cigarettes  . Smokeless tobacco: Never Used  Substance and Sexual Activity  . Alcohol use: Not Currently    Comment: Has not had a drink in 3 years  . Drug use: Yes    Types: Marijuana  . Sexual activity: Not Currently  Lifestyle  . Physical activity:    Days per week: 2 days    Minutes per session: 30 min  . Stress: Not at all  Relationships  . Social connections:    Talks on phone: More than three times a week    Gets together: More than three times a week    Attends  religious service: Never    Active member of club or organization: No    Attends meetings of clubs or organizations: Never    Relationship status: Married  . Intimate partner violence:    Fear of current or ex partner: No    Emotionally abused: No    Physically abused: No    Forced sexual activity: No  Other Topics Concern  . Not on file  Social History Narrative  . Not on file    No outpatient encounter medications on file as of 05/12/2019.   No facility-administered encounter medications on file as of 05/12/2019.     No Known Allergies  Review of Systems  Constitutional: Negative for activity change, appetite change, chills, diaphoresis, fatigue, fever and unexpected weight change.  Eyes: Negative for photophobia and visual disturbance.  Respiratory: Negative for cough, chest tightness and shortness of breath.   Cardiovascular: Negative for chest pain, palpitations and leg swelling.  Gastrointestinal: Negative for abdominal distention and abdominal pain.  Endocrine: Negative for cold intolerance and heat intolerance.  Musculoskeletal: Negative for arthralgias and back pain.  Skin: Negative for color change and pallor.  Neurological: Negative for dizziness, syncope, weakness, light-headedness, numbness and headaches.  Psychiatric/Behavioral: Negative for confusion.  All other systems reviewed and are negative.        Observations/Objective: No vital signs or physical exam, this was a telephone or virtual health encounter.  Pt alert and oriented, answers all questions appropriately, and able to speak in full sentences.    Assessment and Plan: Diagnoses and all orders for this visit:  Pulmonary nodules Abnormal findings on diagnostic imaging of lung No chest pain, shortness of breath, numbness, tingling, weakness, weight loss, fever, chills, or confusion. Due for follow up CT, order placed today.  -     CT Chest Wo Contrast; Future  Ascending aorta dilatation (HCC)  No chest pain, shortness of breath, numbness, tingling, weakness, weight loss, fever, chills, or confusion. Due for follow up CT, order placed today.  -     CT Chest Wo Contrast; Future  Aortic atherosclerosis (HCC) Declines Aspirin therapy.     Follow Up Instructions: Return in about 4 weeks (around 06/09/2019), or if symptoms worsen or fail to improve.    I discussed the assessment and treatment plan with the patient. The patient was provided an opportunity to ask questions and all were answered. The patient agreed with the plan and demonstrated an understanding of the instructions.   The patient was advised to call back or seek an in-person evaluation if the symptoms worsen or if the condition fails to improve as anticipated.  The above assessment and management plan was discussed with the patient. The patient verbalized understanding of and has agreed to the management plan. Patient is  aware to call the clinic if symptoms persist or worsen. Patient is aware when to return to the clinic for a follow-up visit. Patient educated on when it is appropriate to go to the emergency department.    I provided 25 minutes of non-face-to-face time during this encounter. The call started at 0910. The call ended at 0925. The other time was used for coordination of care.    Monia Pouch, FNP-C [facility] [address] [place], [place] [address] [phone]

## 2019-06-09 ENCOUNTER — Other Ambulatory Visit: Payer: Self-pay

## 2019-06-09 ENCOUNTER — Ambulatory Visit (HOSPITAL_COMMUNITY)
Admission: RE | Admit: 2019-06-09 | Discharge: 2019-06-09 | Disposition: A | Discharge status: Home / Self Care | Payer: Medicare Other | Source: Ambulatory Visit | Attending: Family Medicine | Admitting: Family Medicine

## 2019-06-09 DIAGNOSIS — I712 Thoracic aortic aneurysm, without rupture: Secondary | ICD-10-CM | POA: Diagnosis not present

## 2019-06-09 DIAGNOSIS — I77810 Thoracic aortic ectasia: Secondary | ICD-10-CM

## 2019-06-09 DIAGNOSIS — R918 Other nonspecific abnormal finding of lung field: Secondary | ICD-10-CM | POA: Insufficient documentation

## 2019-06-09 DIAGNOSIS — I700 Atherosclerosis of aorta: Secondary | ICD-10-CM | POA: Diagnosis not present

## 2019-06-09 DIAGNOSIS — R911 Solitary pulmonary nodule: Secondary | ICD-10-CM | POA: Diagnosis not present

## 2019-06-10 NOTE — Addendum Note (Signed)
Addended by: Baruch Gouty on: 06/10/2019 08:12 AM   Modules accepted: Orders

## 2019-06-17 ENCOUNTER — Telehealth: Payer: Self-pay

## 2019-07-13 ENCOUNTER — Other Ambulatory Visit: Payer: Self-pay

## 2019-07-14 ENCOUNTER — Encounter: Payer: Self-pay | Admitting: Surgery

## 2019-07-14 ENCOUNTER — Institutional Professional Consult (permissible substitution) (INDEPENDENT_AMBULATORY_CARE_PROVIDER_SITE_OTHER): Payer: Medicare Other | Admitting: Surgery

## 2019-07-14 VITALS — BP 157/75 | HR 75 | Temp 97.7°F | Resp 20 | Ht 75.0 in | Wt 182.0 lb

## 2019-07-14 DIAGNOSIS — I712 Thoracic aortic aneurysm, without rupture, unspecified: Secondary | ICD-10-CM

## 2019-07-14 NOTE — Progress Notes (Signed)
Cardiothoracic Surgery Consultation   PCP is Rakes, Connye Burkitt, FNP Referring Provider is Rakes, Connye Burkitt, FNP  Chief Complaint  Patient presents with  . Thoracic Aortic Aneurysm    Surgical eval, Chest CT 06/09/19    HPI:  Patient is a 73 year old gentleman with a history of active smoking and peripheral vascular disease with left lower extremity claudication who was diagnosed with lung nodules and an ascending aortic aneurysm on a previous CT scan.  CT scan of the chest on 05/09/2018 showed a 4 mm nodule in the right upper lobe, a 5 mm nodule in the left upper lobe, and a 3 mm pulmonary nodule in the right lower lobe.  There is also a 4.4 cm fusiform ascending aortic aneurysm.  He has now been referred by his PCP for follow-up of these abnormalities.  He says that he feels well without any chest pain or shortness of breath.  He continues to smoke about 1/2 pack of cigarettes per day.  There is no family history of aortic aneurysm or dissection.  There is no family history of connective tissue disorder.  Past Medical History:  Diagnosis Date  . Blood pressure elevated 01/20/14   PT'S B/P ELEVATED TODAY   - PT STATES HIS B/P OK WHEN HE CHECKS AT HOME - HE IS ANXIOUS ABOUT HAVING SURGERY.  . Rash    notified doctor on 02/14/15    Past Surgical History:  Procedure Laterality Date  . INGUINAL HERNIA REPAIR Left 01/21/2014   Procedure: OPEN LEFT INGUNIAL HERNIA REPAIR;  Surgeon: Shann Medal, MD;  Location: [facility];  Service: General;  Laterality: Left;  with Mesh  . INGUINAL HERNIA REPAIR Left 02/15/2015   Procedure: LAPAROSCOPIC LEFT  INGUINAL HERNIA REPAIR;  Surgeon: Alphonsa Overall, MD;  Location: [facility];  Service: General;  Laterality: Left;  . INSERTION OF MESH Left 02/15/2015   Procedure: INSERTION OF MESH;  Surgeon: Alphonsa Overall, MD;  Location: [facility];  Service: General;  Laterality: Left;  . KNEE ARTHROSCOPY    . TONSILLECTOMY     AS A CHILD    Family History  Problem Relation Age  of Onset  . Diabetes Mother   . Stroke Mother   . Diabetes Sister     Social History Social History   Tobacco Use  . Smoking status: Current Every Day Smoker    Packs/day: 0.75    Years: 50.00    Pack years: 37.50    Types: Cigarettes  . Smokeless tobacco: Never Used  Substance Use Topics  . Alcohol use: Not Currently    Comment: Has not had a drink in 3 years  . Drug use: Yes    Types: Marijuana    No current outpatient medications on file.   No current facility-administered medications for this visit.     No Known Allergies  Review of Systems  Constitutional: Negative.   HENT: Negative.   Eyes: Negative.   Respiratory: Negative for chest tightness and shortness of breath.   Cardiovascular: Negative for chest pain.       Left calf claudication  Gastrointestinal: Negative.   Endocrine: Negative.   Genitourinary: Negative.   Musculoskeletal: Negative.   Allergic/Immunologic: Negative.   Neurological: Negative.   Hematological: Negative.   Psychiatric/Behavioral: Negative.     BP (!) 157/75   Pulse 75   Temp 97.7 F (36.5 C) (Skin)   Resp 20   Ht 6\' 3"  (1.905 m)   Wt 182 lb (82.6 kg)  SpO2 96% Comment: RA  BMI 22.75 kg/m  Physical Exam Constitutional:      Appearance: Normal appearance. He is normal weight.  Eyes:     Extraocular Movements: Extraocular movements intact.     Pupils: Pupils are equal, round, and reactive to light.  Neck:     Musculoskeletal: Normal range of motion and neck supple.     Vascular: No carotid bruit.  Cardiovascular:     Rate and Rhythm: Normal rate and regular rhythm.     Heart sounds: Normal heart sounds. No murmur.  Pulmonary:     Effort: Pulmonary effort is normal.     Breath sounds: Normal breath sounds.  Lymphadenopathy:     Cervical: No cervical adenopathy.  Neurological:     General: No focal deficit present.     Mental Status: He is alert.  Psychiatric:        Mood and Affect: Mood normal.       Diagnostic Tests:  CLINICAL DATA:  Pulmonary nodule  EXAM: CT CHEST WITHOUT CONTRAST  TECHNIQUE: Multidetector CT imaging of the chest was performed following the standard protocol without IV contrast.  COMPARISON:  None.  FINDINGS: Cardiovascular: There is an ascending thoracic aortic aneurysm measuring approximately 4.6 cm (previously measuring approximately 4.4 cm). Atherosclerotic changes are noted of the thoracic aorta and coronary arteries. Heart size is normal. There is no significant pericardial effusion.  Mediastinum/Nodes: No enlarged mediastinal or axillary lymph nodes. Thyroid gland, trachea, and esophagus demonstrate no significant findings.  Lungs/Pleura: There is a stable 4 mm nodule in the right upper lobe (axial series 4, image 63). There is a stable 5 mm pulmonary nodule in the left upper lobe (axial series 4, image 80). The lungs are otherwise essentially clear. There is no pneumothorax. No large pleural effusion. There is an apparent filling defect within the left mainstem bronchus measuring approximately 8 mm. This is new from prior study and is favored to represent benign debris.  Upper Abdomen: No acute abnormality.  Musculoskeletal: No chest wall mass or suspicious bone lesions identified.  IMPRESSION: 1. Stable bilateral pulmonary nodules as detailed above. These can be followed on the patient's subsequent imaging for the known thoracic aortic aneurysm detailed below. 2. Slight interval growth in the patient's ascending thoracic aortic aneurysm now measuring approximately 4.6 cm in diameter. Ascending thoracic aortic aneurysm. Recommend semi-annual imaging followup by CTA or MRA and referral to cardiothoracic surgery if not already obtained. This recommendation follows 2010 ACCF/AHA/AATS/ACR/ASA/SCA/SCAI/SIR/STS/SVM Guidelines for the Diagnosis and Management of Patients With Thoracic Aortic Disease. Circulation. 2010; 121: Z791-T056.  Aortic aneurysm NOS (ICD10-I71.9) 3. 8 mm filling defect within the left mainstem bronchus is new from prior study and is favored to represent debris. Attention on follow-up examinations is recommended.  Aortic Atherosclerosis (ICD10-I70.0).   Electronically Signed   By: Constance Holster M.D.   On: 06/10/2019 01:45  Impression:  This 73 year old gentleman with a history of active smoking has stable small bilateral pulmonary nodules which had not changed over the past year.  He also has a 4.6 cm fusiform ascending aortic aneurysm which is measured slightly larger than on his CT scan 1 year ago when it was 4.4 cm.  This is still well below the surgical threshold of 5.5 cm.  I reviewed the CT images with him and his wife and answered their questions.  I stressed the importance of good blood pressure control and preventing further enlargement of his aneurysm and aortic dissection.  I also discussed the  importance of complete smoking cessation and controlling his blood pressure and preventing development of worsening COPD and lung cancers.  I advised him against doing any heavy lifting of more than 35 pounds.  I recommended a follow-up CTA of the chest in 1 year to follow-up on his aneurysm and lung nodules.  Plan:  I will plan to see him back in 1 year with a CTA of the chest.  I spent 30 minutes performing this consultation and > 50% of this time was spent face to face counseling and coordinating the care of this patient's ascending aortic aneurysm and bilateral pulmonary nodules.   Gaye Pollack, MD [facility] [phone]

## 2019-10-26 DIAGNOSIS — Z23 Encounter for immunization: Secondary | ICD-10-CM | POA: Diagnosis not present

## 2020-02-10 DIAGNOSIS — Z23 Encounter for immunization: Secondary | ICD-10-CM | POA: Diagnosis not present

## 2020-03-10 DIAGNOSIS — Z23 Encounter for immunization: Secondary | ICD-10-CM | POA: Diagnosis not present

## 2020-06-15 ENCOUNTER — Other Ambulatory Visit: Payer: Self-pay | Admitting: Surgery

## 2020-06-15 DIAGNOSIS — I712 Thoracic aortic aneurysm, without rupture, unspecified: Secondary | ICD-10-CM

## 2020-07-12 ENCOUNTER — Telehealth (INDEPENDENT_AMBULATORY_CARE_PROVIDER_SITE_OTHER): Payer: Medicare Other | Admitting: Surgery

## 2020-07-12 ENCOUNTER — Ambulatory Visit
Admission: RE | Admit: 2020-07-12 | Discharge: 2020-07-12 | Disposition: A | Discharge status: Home / Self Care | Payer: Medicare Other | Source: Ambulatory Visit | Attending: Surgery | Admitting: Surgery

## 2020-07-12 ENCOUNTER — Other Ambulatory Visit: Payer: Self-pay

## 2020-07-12 DIAGNOSIS — I712 Thoracic aortic aneurysm, without rupture, unspecified: Secondary | ICD-10-CM

## 2020-07-12 DIAGNOSIS — I77810 Thoracic aortic ectasia: Secondary | ICD-10-CM | POA: Diagnosis not present

## 2020-07-12 MED ORDER — IOPAMIDOL (ISOVUE-370) INJECTION 76%
75.0000 mL | Freq: Once | INTRAVENOUS | Status: AC | PRN
  Administered 2020-07-12: 75 mL via INTRAVENOUS

## 2020-07-13 ENCOUNTER — Encounter: Payer: Self-pay | Admitting: Surgery

## 2020-07-13 NOTE — Progress Notes (Addendum)
Patient ID: Anthony Rossetti., male   DOB: June 03, 1946, 74 y.o.   MRN: [id number]      [address]       [place] [address]             [phone]     CARDIOTHORACIC SURGERY TELEPHONE VIRTUAL OFFICE NOTE  Referring Provider is Rakes, Connye Burkitt, FNP Primary Cardiologist is No primary care provider on file. PCP is Rakes, Connye Burkitt, FNP   HPI:  I spoke with Anthony Wise. (DOB 1946/10/23 ) via telephone on 07/12/2020 at 5:15 PM and verified that I was speaking with the correct person using more than one form of identification.  We discussed the reason(s) for conducting our visit virtually instead of in-person.  The patient expressed understanding the circumstances and agreed to proceed as described.  Patient: home Provider: office  The patient returns for follow-up of a 4.6 cm fusiform ascending aortic aneurysm which is increased slightly on last year's CT scan compared to 1 year prior when it was measured at 4.4 cm. He also has a history of active smoking with stable small bilateral pulmonary nodules that had not changed over the prior year. He has been feeling well and has no complaints.   No current outpatient medications on file.   No current facility-administered medications for this visit.     Diagnostic Tests:  CLINICAL DATA:  Follow-up thoracic aortic aneurysm.  EXAM: CT ANGIOGRAPHY CHEST WITH CONTRAST  TECHNIQUE: Multidetector CT imaging of the chest was performed using the standard protocol during bolus administration of intravenous contrast. Multiplanar CT image reconstructions and MIPs were obtained to evaluate the vascular anatomy.  Creatinine was obtained on site at [facility] at [address]  Results: Creatinine 0.5 mg/dL.  CONTRAST:  73mL ISOVUE-370 IOPAMIDOL (ISOVUE-370) INJECTION 76%  COMPARISON:  Chest CT 06/09/2019, 05/08/2018  FINDINGS: Cardiovascular: Stable aneurysmal dilatation of the  ascending thoracic aorta with maximal dimension 4.6 cm, measured on series 4, image 84. This is unchanged from previous measurement of 4.6 cm. Again seen aortic atherosclerosis. No dissection or acute aortic abnormality. Transverse and descending thoracic aorta are normal in caliber, descending aorta is tortuous. Conventional branching pattern from the aortic arch. Heart is normal in size. There are coronary artery calcifications. No pericardial effusion.  Mediastinum/Nodes: No enlarged mediastinal or hilar lymph nodes. Scattered subcentimeter mediastinal nodes are not enlarged by size criteria. Tiny 6 mm hypodense right thyroid nodule does not meet criteria for further follow-up. No esophageal wall thickening.  Lungs/Pleura: Mild emphysema and bronchial thickening. Stable pulmonary nodules. This includes 4 mm right upper lobe nodule, series 6, image 65 and left upper lobe 6 mm nodule series 6, image 77. 3 mm right upper lobe nodule series 6, image 62 is also unchanged. No new nodule. Dependent atelectasis or scarring in both lower lobes. No pleural fluid or pulmonary edema.  Upper Abdomen: No acute findings. Fatty atrophy of the pancreas. There is a 12 mm peripancreatic node series 4, image 167 that is stable back to 2019 and likely reactive. Atherosclerosis of the upper abdominal aorta.2  Musculoskeletal: There are no acute or suspicious osseous abnormalities.  Review of the MIP images confirms the above findings.  IMPRESSION: 1. Stable aneurysmal dilatation of the ascending thoracic aorta with maximal dimension 4.6 cm. Recommend semi-annual imaging followup by CTA or MRA and referral to cardiothoracic surgery if not already obtained. This recommendation follows 2010 ACCF/AHA/AATS/ACR/ASA/SCA/SCAI/SIR/STS/SVM Guidelines for the Diagnosis and Management of Patients  With Thoracic Aortic Disease. Circulation. 2010; 121: L845-X646. Aortic aneurysm NOS (ICD10-I71.9) 2.  Aortic atherosclerosis.  Coronary artery calcifications. 3. Stable pulmonary nodules over the course of dating back to May 2019, 2 year stability is consistent with benign process. 4. Emphysema with bronchial thickening.  Aortic Atherosclerosis (ICD10-I70.0) and Emphysema (ICD10-J43.9).   Electronically Signed   By: Keith Rake M.D.   On: 07/12/2020 15:23    Impression:  This 74 year old gentleman has a stable 4.6 cm fusiform ascending aortic aneurysm. This is well below the surgical threshold of 5.5 cm. I discussed this with the patient and stressed the importance of continued good blood pressure control and preventing further enlargement and acute aortic dissection. I recommend he have a follow-up scan in 1 year. He also has stable pulmonary nodules which have not changed in size dating back to May 2019 and are most likely benign.  Plan:  He will return to see me in 1 year with a CTA of the chest.    I discussed limitations of evaluation and management via telephone.  The patient was advised to call back for repeat telephone consultation or to seek an in-person evaluation if questions arise or the patient's clinical condition changes in any significant manner.  I spent 5 minutes of non-face-to-face time during the conduct of this telephone virtual office consultation.     Gaye Pollack, MD 07/12/2020

## 2020-10-26 DIAGNOSIS — Z23 Encounter for immunization: Secondary | ICD-10-CM | POA: Diagnosis not present

## 2020-11-02 DIAGNOSIS — M10071 Idiopathic gout, right ankle and foot: Secondary | ICD-10-CM | POA: Diagnosis not present

## 2020-11-02 DIAGNOSIS — M79671 Pain in right foot: Secondary | ICD-10-CM | POA: Diagnosis not present

## 2020-11-09 DIAGNOSIS — Z23 Encounter for immunization: Secondary | ICD-10-CM | POA: Diagnosis not present

## 2020-11-16 DIAGNOSIS — M10071 Idiopathic gout, right ankle and foot: Secondary | ICD-10-CM | POA: Diagnosis not present

## 2020-11-16 DIAGNOSIS — M79671 Pain in right foot: Secondary | ICD-10-CM | POA: Diagnosis not present

## 2020-12-05 ENCOUNTER — Other Ambulatory Visit (HOSPITAL_COMMUNITY): Payer: Self-pay | Admitting: Podiatry

## 2020-12-05 ENCOUNTER — Other Ambulatory Visit: Payer: Self-pay

## 2020-12-05 ENCOUNTER — Other Ambulatory Visit (HOSPITAL_COMMUNITY)
Admission: RE | Admit: 2020-12-05 | Discharge: 2020-12-05 | Disposition: A | Discharge status: Home / Self Care | Payer: Medicare Other | Source: Ambulatory Visit | Attending: Podiatry | Admitting: Podiatry

## 2020-12-05 ENCOUNTER — Other Ambulatory Visit: Payer: Self-pay | Admitting: Podiatry

## 2020-12-05 DIAGNOSIS — M109 Gout, unspecified: Secondary | ICD-10-CM | POA: Diagnosis not present

## 2020-12-05 DIAGNOSIS — M79671 Pain in right foot: Secondary | ICD-10-CM | POA: Diagnosis not present

## 2020-12-05 DIAGNOSIS — M25571 Pain in right ankle and joints of right foot: Secondary | ICD-10-CM

## 2020-12-05 DIAGNOSIS — M25471 Effusion, right ankle: Secondary | ICD-10-CM

## 2020-12-05 DIAGNOSIS — M10071 Idiopathic gout, right ankle and foot: Secondary | ICD-10-CM | POA: Diagnosis not present

## 2020-12-05 LAB — URIC ACID: Uric Acid, Serum: 4.3 mg/dL (ref 3.7–8.6)

## 2020-12-05 LAB — SEDIMENTATION RATE: Sed Rate: 25 mm/hr — ABNORMAL HIGH (ref 0–16)

## 2020-12-15 ENCOUNTER — Ambulatory Visit (HOSPITAL_COMMUNITY)
Admission: RE | Admit: 2020-12-15 | Discharge: 2020-12-15 | Disposition: A | Discharge status: Home / Self Care | Payer: Medicare Other | Source: Ambulatory Visit | Attending: Podiatry | Admitting: Podiatry

## 2020-12-15 ENCOUNTER — Other Ambulatory Visit: Payer: Self-pay

## 2020-12-15 DIAGNOSIS — S92141A Displaced dome fracture of right talus, initial encounter for closed fracture: Secondary | ICD-10-CM | POA: Diagnosis not present

## 2020-12-15 DIAGNOSIS — R600 Localized edema: Secondary | ICD-10-CM | POA: Diagnosis not present

## 2020-12-15 DIAGNOSIS — M25571 Pain in right ankle and joints of right foot: Secondary | ICD-10-CM | POA: Diagnosis not present

## 2020-12-15 DIAGNOSIS — M25471 Effusion, right ankle: Secondary | ICD-10-CM | POA: Diagnosis not present

## 2021-01-04 DIAGNOSIS — M79671 Pain in right foot: Secondary | ICD-10-CM | POA: Diagnosis not present

## 2021-01-04 DIAGNOSIS — M84374A Stress fracture, right foot, initial encounter for fracture: Secondary | ICD-10-CM | POA: Diagnosis not present

## 2021-01-22 ENCOUNTER — Ambulatory Visit (INDEPENDENT_AMBULATORY_CARE_PROVIDER_SITE_OTHER): Payer: Medicare Other

## 2021-01-22 ENCOUNTER — Other Ambulatory Visit: Payer: Self-pay

## 2021-01-22 ENCOUNTER — Ambulatory Visit (INDEPENDENT_AMBULATORY_CARE_PROVIDER_SITE_OTHER): Payer: Medicare Other | Admitting: Vascular Surgery

## 2021-01-22 ENCOUNTER — Encounter: Payer: Self-pay | Admitting: Vascular Surgery

## 2021-01-22 ENCOUNTER — Other Ambulatory Visit (HOSPITAL_COMMUNITY): Payer: Self-pay | Admitting: Vascular Surgery

## 2021-01-22 VITALS — BP 141/75 | HR 60 | Temp 97.5°F | Resp 16 | Ht 75.0 in | Wt 184.0 lb

## 2021-01-22 DIAGNOSIS — I739 Peripheral vascular disease, unspecified: Secondary | ICD-10-CM

## 2021-01-22 NOTE — Progress Notes (Signed)
[facility]  Patient name: Anthony Wise. MRN: [id number] DOB: 1946-06-10 Sex: male  REASON FOR VISIT: Evaluation arterial flow to right foot and right leg swelling  HPI: Karandeep Resende. is a 75 y.o. male here today for evaluation. I saw him initially 2 years ago December 2019. At that time he was experiencing left calf claudication. This was not limiting to him and I recommended continued walking program only. He recently diagnosed as having a ankle fracture. This was diagnosed by MRI. He is having pain and some swelling around the level of his ankle. He is seen today for evaluation of adequacy of arterial flow. He has no history of DVT.  Past Medical History:  Diagnosis Date  . Blood pressure elevated 01/20/14   PT'S B/P ELEVATED TODAY   - PT STATES HIS B/P OK WHEN HE CHECKS AT HOME - HE IS ANXIOUS ABOUT HAVING SURGERY.  . Rash    notified doctor on 02/14/15    Family History  Problem Relation Age of Onset  . Diabetes Mother   . Stroke Mother   . Diabetes Sister     SOCIAL HISTORY: Social History   Tobacco Use  . Smoking status: Current Every Day Smoker    Packs/day: 0.75    Years: 50.00    Pack years: 37.50    Types: Cigarettes  . Smokeless tobacco: Never Used  Substance Use Topics  . Alcohol use: Not Currently    Comment: Has not had a drink in 3 years    No Known Allergies  No current outpatient medications on file.   No current facility-administered medications for this visit.    REVIEW OF SYSTEMS:  [X]  denotes positive finding, [ ]  denotes negative finding Cardiac  Comments:  Chest pain or chest pressure:    Shortness of breath upon exertion:    Short of breath when lying flat:    Irregular heart rhythm:        Vascular    Pain in calf, thigh, or hip brought on by ambulation: x   Pain in feet at night that wakes you up from your sleep:     Blood clot in your veins:    Leg  swelling:  x         PHYSICAL EXAM: Vitals:   01/22/21 1328  BP: (!) 141/75  Pulse: 60  Resp: 16  Temp: (!) 97.5 F (36.4 C)  TempSrc: Other (Comment)  SpO2: 98%  Weight: 184 lb (83.5 kg)  Height: 6\' 3"  (1.905 m)    GENERAL: The patient is a well-nourished male, in no acute distress. The vital signs are documented above. CARDIOVASCULAR: Moderate swelling in his right leg from his calf down onto his foot. Telangiectasia bilateral lower extremities. I am able to palpate a posterior tibial on the right and I do not palpate pedal pulses on the left PULMONARY: There is good air exchange  MUSCULOSKELETAL: There are no major deformities or cyanosis. NEUROLOGIC: No focal weakness or paresthesias are detected. SKIN: There are no ulcers or rashes noted. PSYCHIATRIC: The patient has a normal affect.  DATA:  Noninvasive studies today reveal ankle arm index normal on the right at 1.0. Moderate flow reduction on the left with an ankle arm index 0.50.  MEDICAL ISSUES: Normal arterial flow to right leg where he has a ankle fracture. Moderate arterial insufficiency on the left with minimal symptoms. He will Continue his walking program. He is having a  moderate amount of swelling from his knee distally into his foot on the right. He reports that this became most noticeable at the time of his fracture. I did discuss the option of compression garments if this is progressive. Currently reports that this is tolerable. I explained the use of knee-high compression if he does have progressive swelling. He was reassured with this discussion will see [place] again on an as-needed basis    Rosetta Posner, MD [facility] Tel [phone]

## 2021-02-06 DIAGNOSIS — M84374D Stress fracture, right foot, subsequent encounter for fracture with routine healing: Secondary | ICD-10-CM | POA: Diagnosis not present

## 2021-04-04 DIAGNOSIS — M79671 Pain in right foot: Secondary | ICD-10-CM | POA: Diagnosis not present

## 2021-04-19 DIAGNOSIS — S90456A Superficial foreign body, unspecified lesser toe(s), initial encounter: Secondary | ICD-10-CM | POA: Diagnosis not present

## 2021-05-03 ENCOUNTER — Ambulatory Visit: Payer: Medicare Other | Admitting: Orthopedic Surgery

## 2021-05-10 ENCOUNTER — Ambulatory Visit: Payer: Medicare Other | Admitting: Orthopedic Surgery

## 2021-07-12 DIAGNOSIS — Z23 Encounter for immunization: Secondary | ICD-10-CM | POA: Diagnosis not present

## 2021-09-28 DIAGNOSIS — M79672 Pain in left foot: Secondary | ICD-10-CM | POA: Diagnosis not present

## 2021-10-26 DIAGNOSIS — U071 COVID-19: Secondary | ICD-10-CM | POA: Diagnosis not present

## 2021-10-26 DIAGNOSIS — Z23 Encounter for immunization: Secondary | ICD-10-CM | POA: Diagnosis not present

## 2021-12-18 DIAGNOSIS — Z23 Encounter for immunization: Secondary | ICD-10-CM | POA: Diagnosis not present

## 2022-01-20 IMAGING — CT CT ANGIO CHEST
2 of 7 series · 16 of 36 positions shown · IV contrast (iopamidol)
Comparison: Chest CT 06/09/2019, 05/08/2018

CLINICAL DATA: Follow-up thoracic aortic aneurysm.

EXAM:
CT ANGIOGRAPHY CHEST WITH CONTRAST
TECHNIQUE: Multidetector CT imaging of the chest was performed using the
standard protocol during bolus administration of intravenous
contrast. Multiplanar CT image reconstructions and MIPs were
obtained to evaluate the vascular anatomy.
Creatinine was obtained on site at [HOSPITAL] at [HOSPITAL].
Results: Creatinine 0.5 mg/dL.
CONTRAST:  75mL [id number] IOPAMIDOL ([id number]-QP4) INJECTION 76%

[Series 8: cta thorax 1.00 bv36 s3 arterial thins · axial · arterial · 0.76mm/px · z∈[+1519,+1838]mm · 15 of 609 slices shown]
[im 39/609  lung]
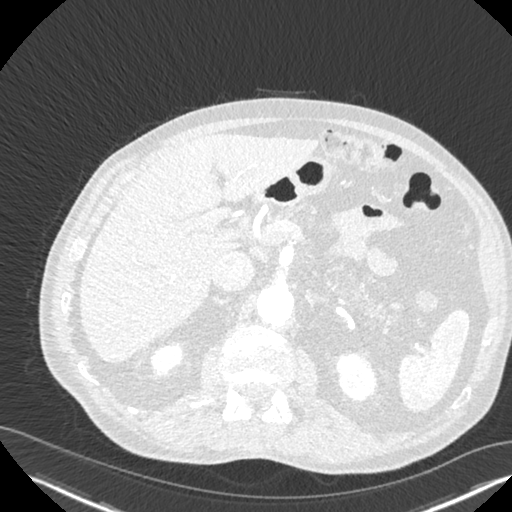
[im 77/609  mediastinal]
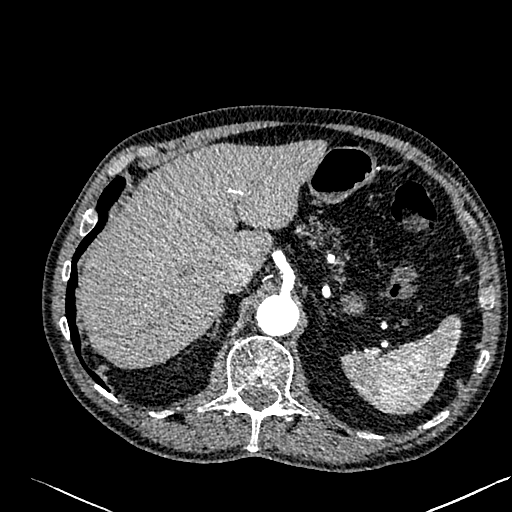
[im 115/609  lung]
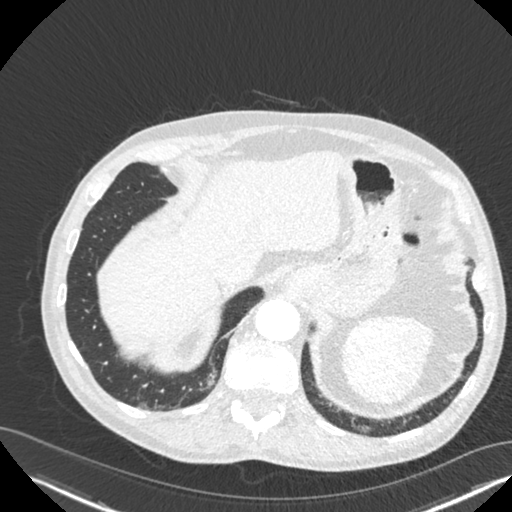
[im 153/609  mediastinal]
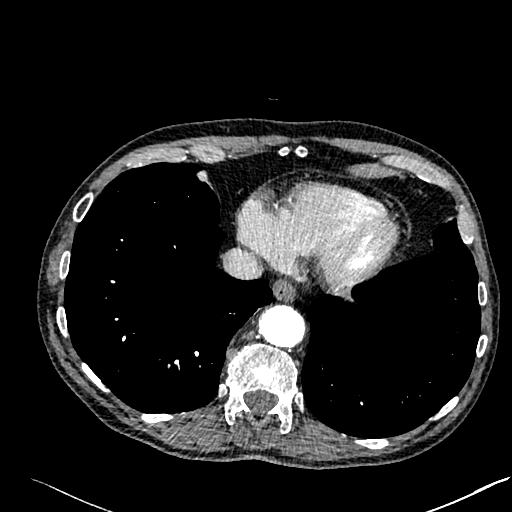
[im 191/609  lung]
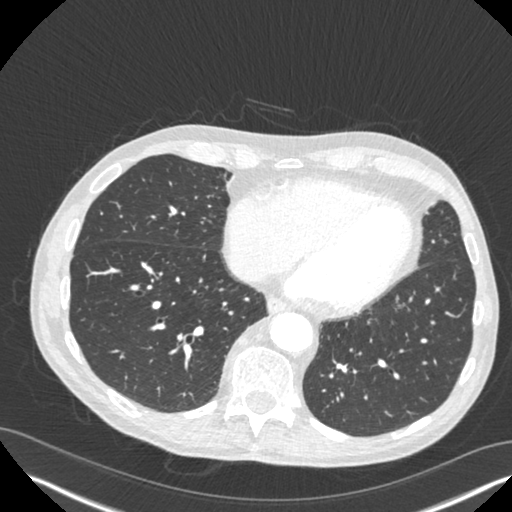
[im 229/609  mediastinal]
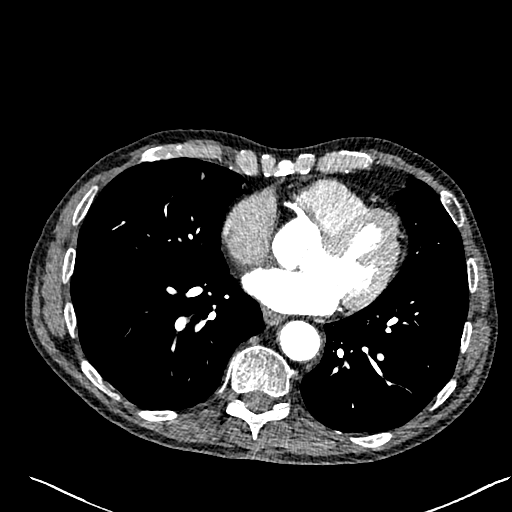
[im 267/609  lung]
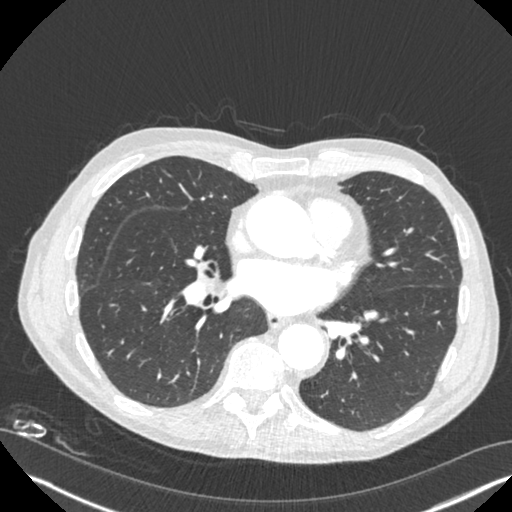
[im 305/609  mediastinal]
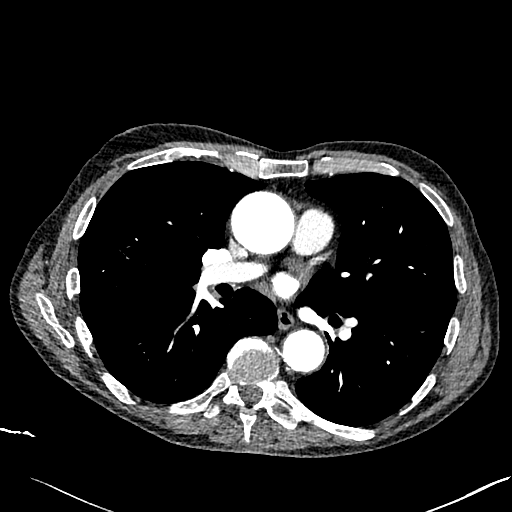
[im 343/609  lung]
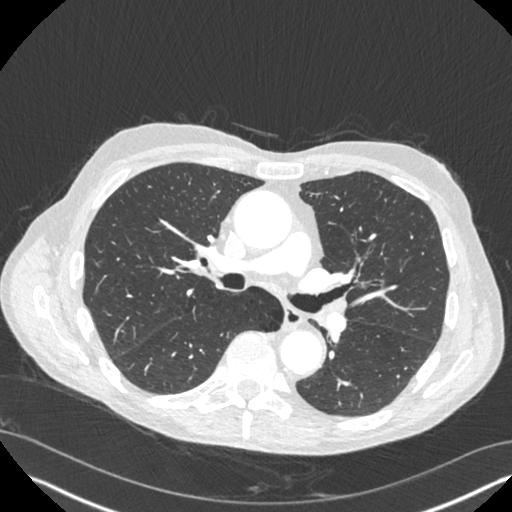
[im 381/609  mediastinal]
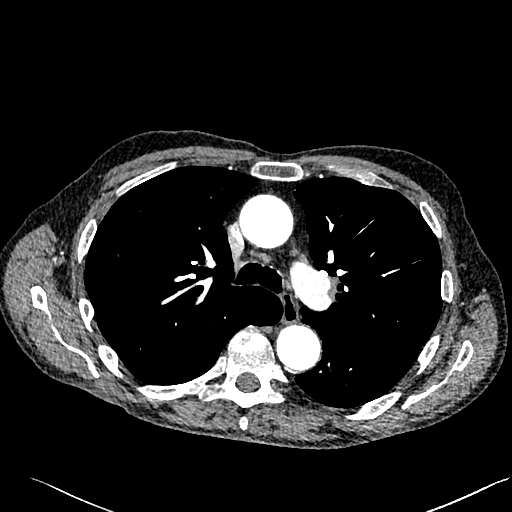
[im 419/609  lung]
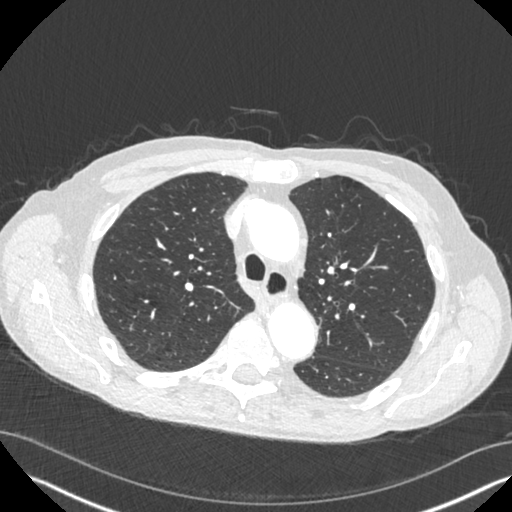
[im 457/609  mediastinal]
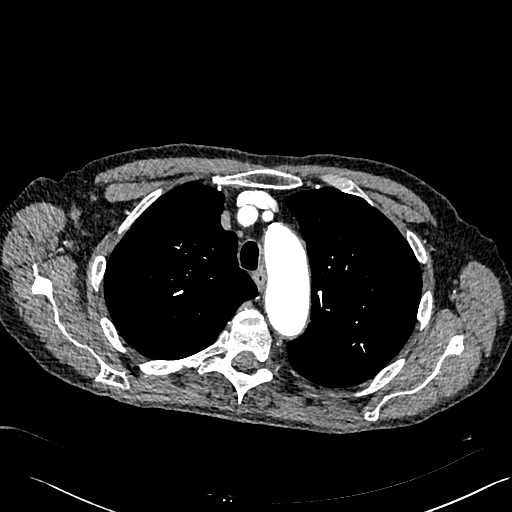
[im 495/609  lung]
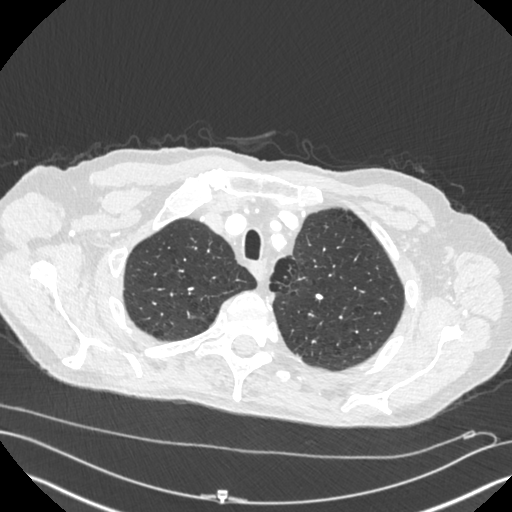
[im 533/609  mediastinal]
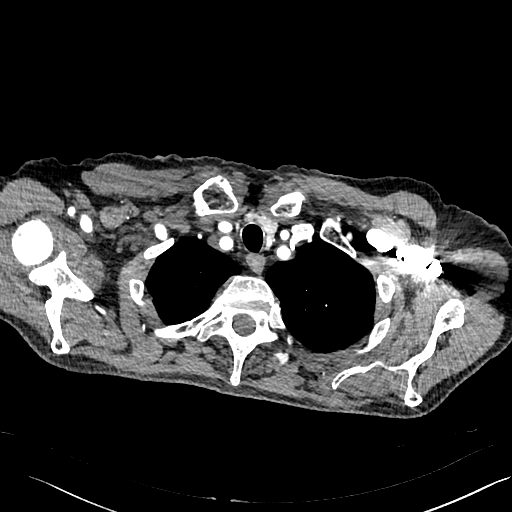
[im 571/609  lung]
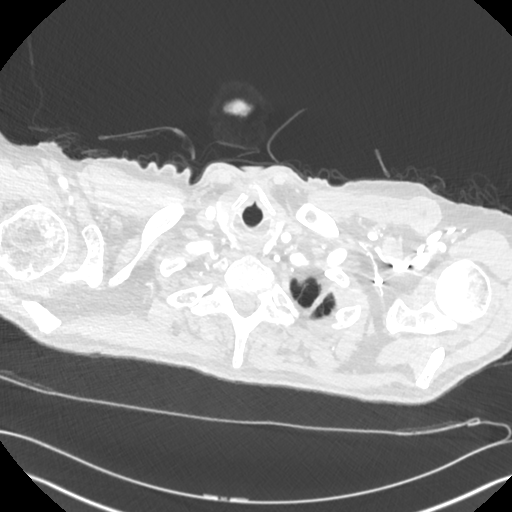

[Series 9: cta thorax 2.00 bv36 s3 cor st · coronal · 0.72mm/px · 1 of 147 slices shown]
[im 74/147  mediastinal]
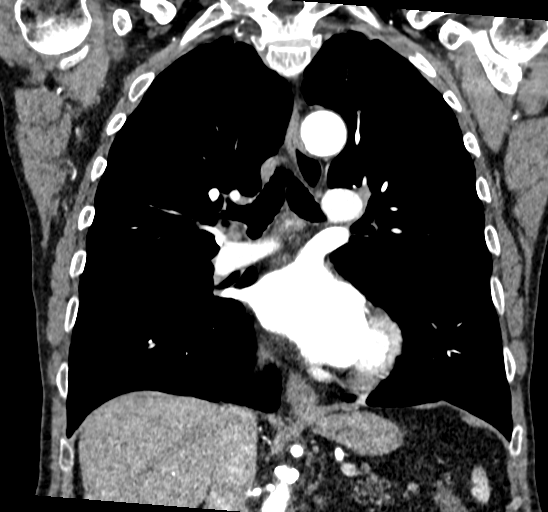

[16 of 36 positions shown; findings below may reference images not displayed]

FINDINGS: Cardiovascular: Stable aneurysmal dilatation of the ascending
thoracic aorta with maximal dimension 4.6 cm, measured on series 4,
image 84. This is unchanged from previous measurement of 4.6 cm.
Again seen aortic atherosclerosis. No dissection or acute aortic
abnormality. Transverse and descending thoracic aorta are normal in
caliber, descending aorta is tortuous. Conventional branching
pattern from the aortic arch. Heart is normal in size. There are
coronary artery calcifications. No pericardial effusion.

Mediastinum/Nodes: No enlarged mediastinal or hilar lymph nodes.
Scattered subcentimeter mediastinal nodes are not enlarged by size
criteria. Tiny 6 mm hypodense right thyroid nodule does not meet
criteria for further follow-up. No esophageal wall thickening.

Lungs/Pleura: Mild emphysema and bronchial thickening. Stable
pulmonary nodules. This includes 4 mm right upper lobe nodule,
series 6, image 65 and left upper lobe 6 mm nodule series 6, image
77. 3 mm right upper lobe nodule series 6, image 62 is also
unchanged. No new nodule. Dependent atelectasis or scarring in both
lower lobes. No pleural fluid or pulmonary edema.

Upper Abdomen: No acute findings. Fatty atrophy of the pancreas.
There is a 12 mm peripancreatic node series 4, image 167 that is
stable back to 7755 and likely reactive. Atherosclerosis of the
upper abdominal aorta.2

Musculoskeletal: There are no acute or suspicious osseous
abnormalities.

Review of the MIP images confirms the above findings.
IMPRESSION: 1. Stable aneurysmal dilatation of the ascending thoracic aorta with
maximal dimension 4.6 cm. Recommend semi-annual imaging followup by
CTA or MRA and referral to cardiothoracic surgery if not already
obtained. This recommendation follows 4303
ACCF/AHA/AATS/ACR/ASA/SCA/BACKLUND/MILLMAN/JEUK/HLEZIPHI Guidelines for the
Diagnosis and Management of Patients With Thoracic Aortic Disease.
Circulation. 4303; 121: E266-e369. Aortic aneurysm NOS (816YX-DJE.Q)
2. Aortic atherosclerosis.  Coronary artery calcifications.
3. Stable pulmonary nodules over the course of dating back to April 2018, 2 year stability is consistent with benign process.
4. Emphysema with bronchial thickening.

Aortic Atherosclerosis (816YX-PLZ.Z) and Emphysema (816YX-QDY.A).

## 2022-10-01 DIAGNOSIS — Z23 Encounter for immunization: Secondary | ICD-10-CM | POA: Diagnosis not present

## 2022-10-25 DIAGNOSIS — Z23 Encounter for immunization: Secondary | ICD-10-CM | POA: Diagnosis not present

## 2023-09-24 DIAGNOSIS — Z23 Encounter for immunization: Secondary | ICD-10-CM | POA: Diagnosis not present

## 2023-11-18 ENCOUNTER — Ambulatory Visit (INDEPENDENT_AMBULATORY_CARE_PROVIDER_SITE_OTHER): Payer: Medicare Other | Admitting: Family Medicine

## 2023-11-18 ENCOUNTER — Encounter: Payer: Self-pay | Admitting: Family Medicine

## 2023-11-18 VITALS — BP 148/61 | HR 58 | Temp 98.3°F | Ht 75.0 in | Wt 160.0 lb

## 2023-11-18 DIAGNOSIS — L309 Dermatitis, unspecified: Secondary | ICD-10-CM

## 2023-11-18 DIAGNOSIS — I739 Peripheral vascular disease, unspecified: Secondary | ICD-10-CM | POA: Diagnosis not present

## 2023-11-18 DIAGNOSIS — R030 Elevated blood-pressure reading, without diagnosis of hypertension: Secondary | ICD-10-CM

## 2023-11-18 DIAGNOSIS — R011 Cardiac murmur, unspecified: Secondary | ICD-10-CM | POA: Diagnosis not present

## 2023-11-18 MED ORDER — AMMONIUM LACTATE 12 % EX LOTN: 1.0000 | TOPICAL_LOTION | Freq: Two times a day (BID) | CUTANEOUS | 0 refills | Status: AC

## 2023-11-18 NOTE — Progress Notes (Signed)
Subjective:  Patient ID: Anthony Sells., male    DOB: [birth year], 77 y.o.   MRN: [id number]  Patient Care Team: Arrie Senate, FNP as PCP - General (Family Medicine)   Chief Complaint:  Foot Swelling  HPI: Anthony Jett. is a 77 y.o. male presenting on 11/18/2023 for Foot Swelling Patient presents today with wife to discuss bilateral foot swelling and skin changes. States that it started 4-5 months ago. Has not tried anything on it. States that it is getting worse.  Reports that he used to see vascular in [place] 2 years ago, but has not followed up. He was previously treated for cellulitis of left lower extremity in 2019.  Relevant past medical, surgical, family, and social history reviewed and updated as indicated.  Allergies and medications reviewed and updated. Data reviewed: Chart in Epic.   Past Medical History:  Diagnosis Date   Blood pressure elevated 01/20/14   PT'S B/P ELEVATED TODAY   - PT STATES HIS B/P OK WHEN HE CHECKS AT HOME - HE IS ANXIOUS ABOUT HAVING SURGERY.   Rash    notified doctor on 02/14/15    Past Surgical History:  Procedure Laterality Date   INGUINAL HERNIA REPAIR Left 01/21/2014   Procedure: OPEN LEFT INGUNIAL HERNIA REPAIR;  Surgeon: Kandis Cocking, MD;  Location: [facility];  Service: General;  Laterality: Left;  with Mesh   INGUINAL HERNIA REPAIR Left 02/15/2015   Procedure: LAPAROSCOPIC LEFT  INGUINAL HERNIA REPAIR;  Surgeon: Ovidio Kin, MD;  Location: [facility];  Service: General;  Laterality: Left;   INSERTION OF MESH Left 02/15/2015   Procedure: INSERTION OF MESH;  Surgeon: Ovidio Kin, MD;  Location: [facility];  Service: General;  Laterality: Left;   KNEE ARTHROSCOPY     TONSILLECTOMY     AS A CHILD    Social History   Socioeconomic History   Marital status: Married    Spouse name: Anthony Wise   Number of children: 2   Years of education: Not on file   Highest education level: Some college, no degree  Occupational History    Not on file  Tobacco Use   Smoking status: Every Day    Current packs/day: 0.75    Average packs/day: 0.8 packs/day for 50.0 years (37.5 ttl pk-yrs)    Types: Cigarettes   Smokeless tobacco: Never  Vaping Use   Vaping status: Never Used  Substance and Sexual Activity   Alcohol use: Not Currently    Comment: Has not had a drink in 3 years   Drug use: Yes    Types: Marijuana   Sexual activity: Not Currently  Other Topics Concern   Not on file  Social History Narrative   Not on file   Social Determinants of Health   Financial Resource Strain: Low Risk  (04/21/2018)   Overall Financial Resource Strain (CARDIA)    Difficulty of Paying Living Expenses: Not hard at all  Food Insecurity: No Food Insecurity (04/21/2018)   Hunger Vital Sign    Worried About Running Out of Food in the Last Year: Never true    Ran Out of Food in the Last Year: Never true  Transportation Needs: No Transportation Needs (04/21/2018)   PRAPARE - Administrator, Civil Service (Medical): No    Lack of Transportation (Non-Medical): No  Physical Activity: Insufficiently Active (04/21/2018)   Exercise Vital Sign    Days of Exercise per Week: 2 days  Minutes of Exercise per Session: 30 min  Stress: No Stress Concern Present (04/21/2018)   [employer] of Occupational Health - Occupational Stress Questionnaire    Feeling of Stress : Not at all  Social Connections: Somewhat Isolated (04/21/2018)   Social Connection and Isolation Panel [NHANES]    Frequency of Communication with Friends and Family: More than three times a week    Frequency of Social Gatherings with Friends and Family: More than three times a week    Attends Religious Services: Never    Database administrator or Organizations: No    Attends Banker Meetings: Never    Marital Status: Married  Catering manager Violence: Not At Risk (04/21/2018)   Humiliation, Afraid, Rape, and Kick questionnaire    Fear of Current or  Ex-Partner: No    Emotionally Abused: No    Physically Abused: No    Sexually Abused: No    No outpatient encounter medications on file as of 11/18/2023.   No facility-administered encounter medications on file as of 11/18/2023.    No Known Allergies  Review of Systems As per HPI  Objective:  BP (!) 148/61   Pulse (!) 58   Temp 98.3 F (36.8 C)   Ht 6\' 3"  (1.905 m)   Wt 160 lb (72.6 kg)   SpO2 98%   BMI 20.00 kg/m    Wt Readings from Last 3 Encounters:  11/18/23 160 lb (72.6 kg)  01/22/21 184 lb (83.5 kg)  07/14/19 182 lb (82.6 kg)   Physical Exam Constitutional:      General: He is awake. He is not in acute distress.    Appearance: Normal appearance. He is well-developed and well-groomed. He is not ill-appearing, toxic-appearing or diaphoretic.  Cardiovascular:     Rate and Rhythm: Normal rate and regular rhythm.     Pulses:          Radial pulses are 2+ on the right side and 2+ on the left side.       Dorsalis pedis pulses are 1+ on the right side and 1+ on the left side.       Posterior tibial pulses are 1+ on the right side and 1+ on the left side.     Heart sounds: Murmur heard.     Systolic murmur is present with a grade of 4/6.     No diastolic murmur is present.     No gallop.  Pulmonary:     Effort: Pulmonary effort is normal. No respiratory distress.     Breath sounds: Normal breath sounds. No stridor. No wheezing, rhonchi or rales.  Musculoskeletal:     Cervical back: Full passive range of motion without pain and neck supple.     Right lower leg: 2+ Edema present.     Left lower leg: 2+ Edema present.  Skin:    General: Skin is warm.     Capillary Refill: Capillary refill takes less than 2 seconds.     Comments: Dry, flaky, crusting, erythematous skin with peeling bilateral lower extremities.   Neurological:     General: No focal deficit present.     Mental Status: He is alert, oriented to person, place, and time and easily aroused. Mental status  is at baseline.     GCS: GCS eye subscore is 4. GCS verbal subscore is 5. GCS motor subscore is 6.     Motor: No weakness.  Psychiatric:        Attention and Perception:  Attention and perception normal.        Mood and Affect: Mood and affect normal.        Speech: Speech normal.        Behavior: Behavior normal. Behavior is cooperative.        Thought Content: Thought content normal. Thought content does not include homicidal or suicidal ideation. Thought content does not include homicidal or suicidal plan.        Cognition and Memory: Cognition and memory normal.        Judgment: Judgment normal.               Results for orders placed or performed during the hospital encounter of 12/05/20  Uric acid  Result Value Ref Range   Uric Acid, Serum 4.3 3.7 - 8.6 mg/dL  Sedimentation rate  Result Value Ref Range   Sed Rate 25 (H) 0 - 16 mm/hr       05/12/2019    9:14 AM 11/11/2018    9:34 AM 05/11/2018    2:19 PM 04/21/2018    8:16 AM 04/02/2018   10:19 AM  Depression screen PHQ 2/9  Decreased Interest 0 0 0 0 0  Down, Depressed, Hopeless 0 0 0 0 0  PHQ - 2 Score 0 0 0 0 0   Pertinent labs & imaging results that were available during my care of the patient were reviewed by me and considered in my medical decision making.  Assessment & Plan:  Krishang was seen today for foot swelling.  Diagnoses and all orders for this visit:  Dermatitis Will start medication as below and refer to Dr. Lajoyce Corners for additional evaluation and management. Reviewed notes from Dr. Arbie Cookey, 01/22/21. Will refer patient to vascular as below for further monitoring.  -     Ambulatory referral to Orthopedic Surgery -     Ambulatory referral to Vascular Surgery -     ammonium lactate (LAC-HYDRIN) 12 % lotion; Apply 1 Application topically in the morning and at bedtime.  Peripheral artery disease (HCC) Will start medication as below and refer to Dr. Lajoyce Corners for additional evaluation and management. Reviewed  notes from Dr. Arbie Cookey, 01/22/21. Will refer patient to vascular as below for further monitoring.  -     Ambulatory referral to Orthopedic Surgery -     Ambulatory referral to Vascular Surgery  Murmur, cardiac Referral as below. Patient unaware of having murmur. Given history of PAD, will refer to cardiology as below.  -     Ambulatory referral to Cardiology  Elevated blood pressure reading Elevated BP in office today. Referrals as above. Will monitor at follow up visit to determine need for treatment.    Continue all other maintenance medications.  Follow up plan: Return in about 2 months (around 01/18/2024) for CPE.   Continue healthy lifestyle choices, including diet (rich in fruits, vegetables, and lean proteins, and low in salt and simple carbohydrates) and exercise (at least 30 minutes of moderate physical activity daily).  Written and verbal instructions provided   The above assessment and management plan was discussed with the patient. The patient verbalized understanding of and has agreed to the management plan. Patient is aware to call the clinic if they develop any new symptoms or if symptoms persist or worsen. Patient is aware when to return to the clinic for a follow-up visit. Patient educated on when it is appropriate to go to the emergency department.   Neale Burly, DNP-FNP [facility] [address]  [address] [place], [place] [address] [phone]

## 2023-11-24 ENCOUNTER — Ambulatory Visit: Payer: Medicare Other | Admitting: Orthopedic Surgery

## 2023-12-04 ENCOUNTER — Ambulatory Visit (INDEPENDENT_AMBULATORY_CARE_PROVIDER_SITE_OTHER): Payer: Medicare Other | Admitting: Orthopedic Surgery

## 2023-12-04 ENCOUNTER — Encounter: Payer: Self-pay | Admitting: Orthopedic Surgery

## 2023-12-04 DIAGNOSIS — I739 Peripheral vascular disease, unspecified: Secondary | ICD-10-CM | POA: Diagnosis not present

## 2023-12-04 DIAGNOSIS — I872 Venous insufficiency (chronic) (peripheral): Secondary | ICD-10-CM | POA: Diagnosis not present

## 2023-12-04 MED ORDER — PENTOXIFYLLINE ER 400 MG PO TBCR: 400.0000 mg | EXTENDED_RELEASE_TABLET | Freq: Three times a day (TID) | ORAL | 1 refills | Status: AC

## 2023-12-04 NOTE — Progress Notes (Signed)
Office Visit Note   Patient: Anthony Wise.           Date of Birth: Jul 31, 1946           MRN: [id number] Visit Date: 12/04/2023              Requested by: Arrie Senate, FNP [address] [place],  [place] [address] PCP: Arrie Senate, FNP  Chief Complaint  Patient presents with   Left Leg - Pain   Right Leg - Pain      HPI: Patient is a 77 year old gentleman who is seen for evaluation for dermatitis of both lower extremities.  Patient denies any claudication with ambulation.  Patient states he has hypertension but is not on medicine for it.  He has a history of smoking and is still smoking.  Patient states in the past he did have ankle-brachial indices performed and he states his blood flow on the left was 50%.  Patient states he has appointment with vascular vein surgery in  this next month.  Assessment & Plan: Visit Diagnoses:  1. PAD (peripheral artery disease) (HCC)   2. Venous stasis dermatitis of both lower extremities     Plan: Plan: We will place order for ankle-brachial indices and he Panza this is available for his vascular appointment.  Prescription called in for Trental 400 mg 3 times a day to help with his circulation.  A prescription was written for patient to get a pair of knee-high compression socks size large.  Follow-Up Instructions: Return in about 4 weeks (around 01/01/2024).   Ortho Exam  Patient is alert, oriented, no adenopathy, well-dressed, normal affect, normal respiratory effort. Examination patient does not have palpable pulses in both lower extremities.  He has dermatitis of the foot ankle and distal calf.  The right calf is 37 cm in circumference left calf is 35 cm in circumference.  A Doppler was used and he has a monophasic dorsalis pedis pulse bilaterally.  I cannot Doppler a good posterior tibial pulse bilaterally.  Imaging: No results found. No images are attached to the encounter.  Labs: Lab Results   Component Value Date   HGBA1C 5.6 04/02/2018   ESRSEDRATE 25 (H) 12/05/2020   LABURIC 4.3 12/05/2020     Lab Results  Component Value Date   ALBUMIN 4.0 04/02/2018   ALBUMIN 4.4 12/06/2014   ALBUMIN 4.4 11/29/2013    No results found for: "MG" No results found for: "VD25OH"  No results found for: "PREALBUMIN"    Latest Ref Rng & Units 04/02/2018   10:44 AM 02/15/2015    8:05 AM 12/20/2014    6:10 PM  CBC EXTENDED  WBC 3.4 - 10.8 x10E3/uL 6.6  3.9  4.9   RBC 4.14 - 5.80 x10E6/uL 4.54  4.39  4.63   Hemoglobin 13.0 - 17.7 g/dL 57.8  46.9  62.9   HCT 37.5 - 51.0 % 39.4  39.8  41.3   Platelets 150 - 379 x10E3/uL 305  172  203   NEUT# 1.4 - 7.0 x10E3/uL 3.7     Lymph# 0.7 - 3.1 x10E3/uL 2.0        There is no height or weight on file to calculate BMI.  Orders:  Orders Placed This Encounter  Procedures   VAS [place] ABI WITH/WO TBI   Meds ordered this encounter  Medications   pentoxifylline (TRENTAL) 400 MG CR tablet    Sig: Take 1 tablet (400 mg total) by mouth 3 (  three) times daily with meals.    Dispense:  90 tablet    Refill:  1     Procedures: No procedures performed  Clinical Data: No additional findings.  ROS:  All other systems negative, except as noted in the HPI. Review of Systems  Objective: Vital Signs: There were no vitals taken for this visit.  Specialty Comments:  No specialty comments available.  PMFS History: Patient Active Problem List   Diagnosis Date Noted   Aortic atherosclerosis (HCC) 05/12/2019   Ascending aorta dilatation (HCC) 05/16/2018   Pulmonary nodules 05/16/2018   Incarcerated left inguinal hernia 01/21/2014   Past Medical History:  Diagnosis Date   Blood pressure elevated 01/20/14   PT'S B/P ELEVATED TODAY   - PT STATES HIS B/P OK WHEN HE CHECKS AT HOME - HE IS ANXIOUS ABOUT HAVING SURGERY.   Rash    notified doctor on 02/14/15    Family History  Problem Relation Age of Onset   Diabetes Mother    Stroke Mother     Diabetes Sister     Past Surgical History:  Procedure Laterality Date   INGUINAL HERNIA REPAIR Left 01/21/2014   Procedure: OPEN LEFT INGUNIAL HERNIA REPAIR;  Surgeon: Kandis Cocking, MD;  Location: [facility];  Service: General;  Laterality: Left;  with Mesh   INGUINAL HERNIA REPAIR Left 02/15/2015   Procedure: LAPAROSCOPIC LEFT  INGUINAL HERNIA REPAIR;  Surgeon: Ovidio Kin, MD;  Location: [facility];  Service: General;  Laterality: Left;   INSERTION OF MESH Left 02/15/2015   Procedure: INSERTION OF MESH;  Surgeon: Ovidio Kin, MD;  Location: [facility];  Service: General;  Laterality: Left;   KNEE ARTHROSCOPY     TONSILLECTOMY     AS A CHILD   Social History   Occupational History   Not on file  Tobacco Use   Smoking status: Every Day    Current packs/day: 0.75    Average packs/day: 0.8 packs/day for 50.0 years (37.5 ttl pk-yrs)    Types: Cigarettes   Smokeless tobacco: Never  Vaping Use   Vaping status: Never Used  Substance and Sexual Activity   Alcohol use: Not Currently    Comment: Has not had a drink in 3 years   Drug use: Yes    Types: Marijuana   Sexual activity: Not Currently

## 2023-12-05 ENCOUNTER — Telehealth: Payer: Self-pay | Admitting: Orthopedic Surgery

## 2023-12-05 NOTE — Telephone Encounter (Signed)
Patient's wife Alvino Chapel called advised  the compression socks did not look large enough when they looked at the large size socks.   Alvino Chapel said she think they need to get the Extra large socks. Alvino Chapel said they went by the supply store yesterday and the socks did not look big enough. The number to contact Alvino Chapel is [phone].

## 2023-12-05 NOTE — Telephone Encounter (Signed)
I tried to call pt and voice mail has not been set up yet unable to leave a message. Will hold and try again.

## 2023-12-08 ENCOUNTER — Telehealth: Payer: Self-pay | Admitting: Orthopedic Surgery

## 2023-12-08 NOTE — Telephone Encounter (Signed)
Will sign off on this message duplicate will call back once Dr. Lajoyce Corners has advised.

## 2023-12-08 NOTE — Telephone Encounter (Signed)
I called and sw pt's wife and advised of message below. Voiced understanding and will call with any questions or concerns.

## 2023-12-08 NOTE — Telephone Encounter (Signed)
Pt called and said that you had advised to get a large sock and they are concerned because his shoe size is 14-15 and think that the sock ewill not fit his foot and question if he needs a larger size please advise.

## 2023-12-08 NOTE — Telephone Encounter (Signed)
Patient wife called and said that Anthony Wise put a large for his compression socks when in fact in may need a big sock. He wears a size 14-15 shoe. CB#[phone]

## 2024-01-12 ENCOUNTER — Other Ambulatory Visit: Payer: Self-pay

## 2024-01-12 DIAGNOSIS — I739 Peripheral vascular disease, unspecified: Secondary | ICD-10-CM

## 2024-01-13 ENCOUNTER — Encounter: Payer: Self-pay | Admitting: Vascular Surgery

## 2024-01-13 ENCOUNTER — Ambulatory Visit (INDEPENDENT_AMBULATORY_CARE_PROVIDER_SITE_OTHER): Payer: Medicare Other | Admitting: Vascular Surgery

## 2024-01-13 ENCOUNTER — Ambulatory Visit (INDEPENDENT_AMBULATORY_CARE_PROVIDER_SITE_OTHER): Payer: Medicare Other

## 2024-01-13 VITALS — BP 134/66 | HR 65 | Ht 75.0 in | Wt 166.0 lb

## 2024-01-13 DIAGNOSIS — I872 Venous insufficiency (chronic) (peripheral): Secondary | ICD-10-CM | POA: Diagnosis not present

## 2024-01-13 DIAGNOSIS — I739 Peripheral vascular disease, unspecified: Secondary | ICD-10-CM

## 2024-01-13 LAB — VAS US ABI WITH/WO TBI
Left ABI: 0.45
Right ABI: 0.94

## 2024-01-13 NOTE — Progress Notes (Signed)
 [facility] [place]  ASSESSMENT / PLAN: Anthony Sakuma. is a 78 y.o. male with atherosclerosis of native arteries of bilateral lower extremities causing no symptoms.  The patient has chronic venous insufficiency causing swelling and inflammatory skin changes (C4 disease).  Recommend:  Abstinence from all tobacco products. Blood glucose control with goal A1c < 7%. Blood pressure control with goal blood pressure < 140/90 mmHg. Lipid reduction therapy with goal LDL-C <100 mg/dL Aspirin  81mg  PO QD.  Atorvastatin 40-80mg  PO QD (or other high intensity statin therapy).  Recommend compression and elevation for lower extremity swelling.  Would not recommend endovenous ablation of the saphenous vein given peripheral arterial disease.  Follow-up with me as needed.  CHIEF COMPLAINT: Lower extremity discoloration  HISTORY OF PRESENT ILLNESS: Anthony Kelley. is a 78 y.o. male who presents to clinic for evaluation of lower extremity swelling and discoloration.  He saw Dr. Harden 12/04/2023 who diagnosed him with chronic venous insufficiency and performed a Doppler exam worrisome for peripheral arterial disease.  The patient reports significant swelling and cyanotic discoloration of his toes.  This is minimally bothersome to him.  He has no claudication symptoms.  He has no rest pain symptoms.  He has no ulcers about his feet.  We reviewed his noninvasive testing in detail.  Past Medical History:  Diagnosis Date   Blood pressure elevated 01/20/14   PT'S B/P ELEVATED TODAY   - PT STATES HIS B/P OK WHEN HE CHECKS AT HOME - HE IS ANXIOUS ABOUT HAVING SURGERY.   Rash    notified doctor on 02/14/15    Past Surgical History:  Procedure Laterality Date   INGUINAL HERNIA REPAIR Left 01/21/2014   Procedure: OPEN LEFT INGUNIAL HERNIA REPAIR;  Surgeon: Alm VEAR Angle, MD;  Location: [facility];  Service: General;  Laterality: Left;  with Mesh   INGUINAL HERNIA REPAIR Left 02/15/2015    Procedure: LAPAROSCOPIC LEFT  INGUINAL HERNIA REPAIR;  Surgeon: Alm Angle, MD;  Location: [facility];  Service: General;  Laterality: Left;   INSERTION OF MESH Left 02/15/2015   Procedure: INSERTION OF MESH;  Surgeon: Alm Angle, MD;  Location: [facility];  Service: General;  Laterality: Left;   KNEE ARTHROSCOPY     TONSILLECTOMY     AS A CHILD    Family History  Problem Relation Age of Onset   Diabetes Mother    Stroke Mother    Diabetes Sister     Social History   Socioeconomic History   Marital status: Married    Spouse name: Sherrilyn   Number of children: 2   Years of education: Not on file   Highest education level: Some college, no degree  Occupational History   Not on file  Tobacco Use   Smoking status: Former    Current packs/day: 0.00    Average packs/day: 0.8 packs/day for 50.0 years (37.5 ttl pk-yrs)    Types: Cigarettes    Quit date: 11/2023    Years since quitting: 0.1   Smokeless tobacco: Never  Vaping Use   Vaping status: Never Used  Substance and Sexual Activity   Alcohol use: Not Currently    Comment: Has not had a drink in 3 years   Drug use: Yes    Types: Marijuana   Sexual activity: Not Currently  Other Topics Concern   Not on file  Social History Narrative   Not on file   Social Drivers of Health   Financial Resource Strain:  Low Risk  (04/21/2018)   Overall Financial Resource Strain (CARDIA)    Difficulty of Paying Living Expenses: Not hard at all  Food Insecurity: No Food Insecurity (04/21/2018)   Hunger Vital Sign    Worried About Running Out of Food in the Last Year: Never true    Ran Out of Food in the Last Year: Never true  Transportation Needs: No Transportation Needs (04/21/2018)   PRAPARE - Administrator, Civil Service (Medical): No    Lack of Transportation (Non-Medical): No  Physical Activity: Insufficiently Active (04/21/2018)   Exercise Vital Sign    Days of Exercise per Week: 2 days    Minutes of Exercise per Session: 30  min  Stress: No Stress Concern Present (04/21/2018)   [employer] of Occupational Health - Occupational Stress Questionnaire    Feeling of Stress : Not at all  Social Connections: Somewhat Isolated (04/21/2018)   Social Connection and Isolation Panel [NHANES]    Frequency of Communication with Friends and Family: More than three times a week    Frequency of Social Gatherings with Friends and Family: More than three times a week    Attends Religious Services: Never    Database Administrator or Organizations: No    Attends Banker Meetings: Never    Marital Status: Married  Catering Manager Violence: Not At Risk (04/21/2018)   Humiliation, Afraid, Rape, and Kick questionnaire    Fear of Current or Ex-Partner: No    Emotionally Abused: No    Physically Abused: No    Sexually Abused: No    No Known Allergies  Current Outpatient Medications  Medication Sig Dispense Refill   ammonium lactate  (LAC-HYDRIN ) 12 % lotion Apply 1 Application topically in the morning and at bedtime. 450 each 0   pentoxifylline  (TRENTAL ) 400 MG CR tablet Take 1 tablet (400 mg total) by mouth 3 (three) times daily with meals. 90 tablet 1   No current facility-administered medications for this visit.    PHYSICAL EXAM Vitals:   01/13/24 1137  BP: 134/66  Pulse: 65  Weight: 166 lb (75.3 kg)  Height: 6' 3 (1.905 m)   Elderly man in no distress Regular rate and rhythm Unlabored breathing No palpable pedal pulses Acrocyanosis of the feet bilaterally.  Significant swelling about the proximal feet and ankles, all consistent with chronic venous insufficiency  PERTINENT LABORATORY AND RADIOLOGIC DATA  Most recent CBC    Latest Ref Rng & Units 04/02/2018   10:44 AM 02/15/2015    8:05 AM 12/20/2014    6:10 PM  CBC  WBC 3.4 - 10.8 x10E3/uL 6.6  3.9  4.9   Hemoglobin 13.0 - 17.7 g/dL 86.3  85.9  85.4   Hematocrit 37.5 - 51.0 % 39.4  39.8  41.3   Platelets 150 - 379 x10E3/uL 305  172  203       Most recent CMP    Latest Ref Rng & Units 04/02/2018   10:44 AM 12/20/2014    6:10 PM 12/06/2014   10:39 AM  CMP  Glucose 65 - 99 mg/dL 879  93  85   BUN 8 - 27 mg/dL 9  5  6    Creatinine 0.76 - 1.27 mg/dL 9.34  9.26  9.34   Sodium 134 - 144 mmol/L 137  126  131   Potassium 3.5 - 5.2 mmol/L 4.2  4.0  5.0   Chloride 96 - 106 mmol/L 100  92  93  CO2 20 - 29 mmol/L 22  21  23    Calcium 8.6 - 10.2 mg/dL 8.9  9.2  9.3   Total Protein 6.0 - 8.5 g/dL 7.3   7.0   Total Bilirubin 0.0 - 1.2 mg/dL 0.4   0.9   Alkaline Phos 39 - 117 IU/L 116   65   AST 0 - 40 IU/L 16   37   ALT 0 - 44 IU/L 5   18     Renal function CrCl cannot be calculated (Patient's most recent lab result is older than the maximum 21 days allowed.).  Hgb A1c MFr Bld (%)  Date Value  04/02/2018 5.6    LDL Calculated  Date Value Ref Range Status  04/02/2018 92 0 - 99 mg/dL Final     +-------+-----------+-----------+------------+------------+  ABI/TBIToday's ABIToday's TBIPrevious ABIPrevious TBI  +-------+-----------+-----------+------------+------------+  Right 0.94       0.56       0.95        0.85          +-------+-----------+-----------+------------+------------+  Left  0.45       0.32       0.5         0.42          +-------+-----------+-----------+------------+------------+   Debby SAILOR. Magda, MD FACS [facility] Phone Number: [phone] 01/13/2024 4:00 PM   Total time spent on preparing this encounter including chart review, data review, collecting history, examining the patient, coordinating care for this established patient, 30 minutes.  Portions of this report may have been transcribed using voice recognition software.  Every effort has been made to ensure accuracy; however, inadvertent computerized transcription errors may still be present.

## 2024-01-24 DIAGNOSIS — R011 Cardiac murmur, unspecified: Secondary | ICD-10-CM | POA: Insufficient documentation

## 2024-01-24 NOTE — Progress Notes (Unsigned)
Cardiology Office Note   Date:  01/26/2024   ID:  Anthony Nhan., DOB 08/23/1946, MRN [id number]  PCP:  Arrie Senate, FNP  Cardiologist:   Rollene Rotunda, MD Referring:  Arrie Senate*  No chief complaint on file.     History of Present Illness: Anthony Conwell. is a 78 y.o. male who was referred for evaluation of aortic atherosclerosis and a murmur.  The patient has no cardiac history.  He has been seen for some venous insufficiency and also has been noted to have probable severe left lower extremity disease with abnormal ABIs.  It is probably moderate on the right.  He did see Dr. Lenell Antu with vascular surgery and is being managed conservatively.  I also note that he has had an aorta ascending of 46 mm and had CT to follow-up on this.  He was a cigarette smoker up till about a month ago.  He was advised to quit this because of vascular disease.  He is otherwise not had any cardiac history.  He has noted to have a murmur and is sent here for evaluation of that and atherosclerosis noted not only on his peripheral exam but also on a CT demonstrating aortic atherosclerosis and coronary calcium  He is active around his house.  He splits wood.  The patient denies any new symptoms such as chest discomfort, neck or arm discomfort. There has been no new shortness of breath, PND or orthopnea. There have been no reported palpitations, presyncope or syncope.   Past Medical History:  Diagnosis Date   Blood pressure elevated 01/20/2014   PT'S B/P ELEVATED TODAY   - PT STATES HIS B/P OK WHEN HE CHECKS AT HOME - HE IS ANXIOUS ABOUT HAVING SURGERY.    Past Surgical History:  Procedure Laterality Date   INGUINAL HERNIA REPAIR Left 01/21/2014   Procedure: OPEN LEFT INGUNIAL HERNIA REPAIR;  Surgeon: Kandis Cocking, MD;  Location: [facility];  Service: General;  Laterality: Left;  with Mesh   INGUINAL HERNIA REPAIR Left 02/15/2015   Procedure: LAPAROSCOPIC LEFT  INGUINAL HERNIA  REPAIR;  Surgeon: Ovidio Kin, MD;  Location: [facility];  Service: General;  Laterality: Left;   INSERTION OF MESH Left 02/15/2015   Procedure: INSERTION OF MESH;  Surgeon: Ovidio Kin, MD;  Location: [facility];  Service: General;  Laterality: Left;   KNEE ARTHROSCOPY     TONSILLECTOMY     AS A CHILD     Current Outpatient Medications  Medication Sig Dispense Refill   ammonium lactate (LAC-HYDRIN) 12 % lotion Apply 1 Application topically in the morning and at bedtime. 450 each 0   aspirin EC 81 MG tablet Take 1 tablet (81 mg total) by mouth daily. Swallow whole.     pentoxifylline (TRENTAL) 400 MG CR tablet Take 1 tablet (400 mg total) by mouth 3 (three) times daily with meals. 90 tablet 1   pravastatin (PRAVACHOL) 40 MG tablet Take 1 tablet (40 mg total) by mouth every evening. 90 tablet 3   No current facility-administered medications for this visit.    Allergies:   Patient has no known allergies.    Social History:  The patient  reports that he quit smoking about 8 weeks ago. His smoking use included cigarettes. He has a 37.5 pack-year smoking history. He has never used smokeless tobacco. He reports that he does not currently use alcohol. He reports current drug use. Drug: Marijuana.   Family History:  The  patient's family history includes Diabetes in his mother and sister; Stroke in his mother.    ROS:  Please see the history of present illness.   Otherwise, review of systems are positive for none.   All other systems are reviewed and negative.    PHYSICAL EXAM: VS:  BP 130/70 (BP Location: Left Arm, Patient Position: Sitting)   Pulse (!) 50   Ht 6\' 3"  (1.905 m)   Wt 165 lb 12.8 oz (75.2 kg)   SpO2 98%   BMI 20.72 kg/m  , BMI Body mass index is 20.72 kg/m. GENERAL:  Well appearing HEENT:  Pupils equal round and reactive, fundi not visualized, oral mucosa unremarkable NECK:  No jugular venous distention, waveform within normal limits, carotid upstroke brisk and symmetric, no  bruits, no thyromegaly LYMPHATICS:  No cervical, inguinal adenopathy LUNGS:  Clear to auscultation bilaterally BACK:  No CVA tenderness CHEST:  Unremarkable HEART:  PMI not displaced or sustained,S1 and S2 within normal limits, no S3, no S4, no clicks, no rubs, 2 out of 6 apical systolic murmur early to mid peaking, no diastolic murmurs ABD:  Flat, positive bowel sounds normal in frequency in pitch, no bruits, no rebound, no guarding, no midline pulsatile mass, no hepatomegaly, no splenomegaly EXT:  2 plus pulses upper, decreased dorsalis pedis and posterior tibialis left greater than right, no edema, no cyanosis no clubbing SKIN:  No rashes no nodules NEURO:  Cranial nerves II through XII grossly intact, motor grossly intact throughout [facility]:  Cognitively intact, oriented to person place and time    EKG:  EKG Interpretation Date/Time:  Monday January 26 2024 09:20:30 EST Ventricular Rate:  50 PR Interval:  162 QRS Duration:  78 QT Interval:  454 QTC Calculation: 413 R Axis:   20  Text Interpretation: Sinus bradycardia When compared with ECG of 20-Jan-2014 14:23, No significant change was found RSR' or QR pattern in V1 suggests right ventricular conduction delay Confirmed by Rollene Rotunda ([id number]) on 01/26/2024 9:49:39 AM     Recent Labs: No results found for requested labs within last 365 days.    Lipid Panel    Component Value Date/Time   CHOL 145 04/02/2018 1044   TRIG 101 04/02/2018 1044   HDL 33 (L) 04/02/2018 1044   CHOLHDL 4.4 04/02/2018 1044   LDLCALC 92 04/02/2018 1044      Wt Readings from Last 3 Encounters:  01/26/24 165 lb 12.8 oz (75.2 kg)  01/13/24 166 lb (75.3 kg)  11/18/23 160 lb (72.6 kg)      Other studies Reviewed: Additional studies/ records that were reviewed today include: Labs. Review of the above records demonstrates:  Please see elsewhere in the note.     ASSESSMENT AND PLAN:  Aortic atherosclerosis: He has peripheral vascular disease  and evidence of plaque elsewhere.  He is not having any overt symptoms.  We are managing with aggressive primary risk reduction as below.  I am going to put him on aspirin 81 mg daily given his high risk score greater than 10  Murmur: I suspect some mild aortic stenosis and I will check an echocardiogram and follow this.  Ascending aortic dilatation: We will judge this at the time of his echo.  It is very likely that he is also getting the a CT angiogram of his chest once I see this.  Dyslipidemia: I would like his LDL to be in the 50s and I am going to start him on pravastatin 40 mg daily.  He  can have a lipid profile.  3 months.  Current medicines are reviewed at length with the patient today.  The patient does not have concerns regarding medicines.  The following changes have been made:  as above  Labs/ tests ordered today include:   Orders Placed This Encounter  Procedures   Lipid panel   EKG 12-Lead   ECHOCARDIOGRAM COMPLETE     Disposition:   FU with with me in 12 months in [place].     Signed, Rollene Rotunda, MD  01/26/2024 10:05 AM    [facility]

## 2024-01-26 ENCOUNTER — Encounter: Payer: Self-pay | Admitting: Cardiology

## 2024-01-26 ENCOUNTER — Ambulatory Visit: Payer: Medicare Other | Attending: Cardiology | Admitting: Cardiology

## 2024-01-26 VITALS — BP 130/70 | HR 50 | Ht 75.0 in | Wt 165.8 lb

## 2024-01-26 DIAGNOSIS — R011 Cardiac murmur, unspecified: Secondary | ICD-10-CM | POA: Diagnosis not present

## 2024-01-26 DIAGNOSIS — I700 Atherosclerosis of aorta: Secondary | ICD-10-CM | POA: Diagnosis not present

## 2024-01-26 DIAGNOSIS — E785 Hyperlipidemia, unspecified: Secondary | ICD-10-CM | POA: Diagnosis not present

## 2024-01-26 MED ORDER — PRAVASTATIN SODIUM 40 MG PO TABS: 40.0000 mg | ORAL_TABLET | Freq: Every evening | ORAL | 3 refills | Status: DC

## 2024-01-26 MED ORDER — ASPIRIN 81 MG PO TBEC: 81.0000 mg | DELAYED_RELEASE_TABLET | Freq: Every day | ORAL | Status: AC

## 2024-01-26 NOTE — Patient Instructions (Signed)
Medication Instructions:  Start pravastatin 40 mg by mouth daily. New script sent. *If you need a refill on your cardiac medications before your next appointment, please call your pharmacy*   Lab Work: Fasting Lipid profile in 3 months.. If you have labs (blood work) drawn today and your tests are completely normal, you will receive your results only by: MyChart Message (if you have MyChart) OR A paper copy in the mail If you have any lab test that is abnormal or we need to change your treatment, we will call you to review the results.   Testing/Procedures: Your physician has requested that you have an echocardiogram. Echocardiography is a painless test that uses sound waves to create images of your heart. It provides your doctor with information about the size and shape of your heart and how well your heart's chambers and valves are working. This procedure takes approximately one hour. There are no restrictions for this procedure. Please do NOT wear cologne, perfume, aftershave, or lotions (deodorant is allowed). Please arrive 15 minutes prior to your appointment time.  Please note: We ask at that you not bring children with you during ultrasound (echo/ vascular) testing. Due to room size and safety concerns, children are not allowed in the ultrasound rooms during exams. Our front office staff cannot provide observation of children in our lobby area while testing is being conducted. An adult accompanying a patient to their appointment will only be allowed in the ultrasound room at the discretion of the ultrasound technician under special circumstances. We apologize for any inconvenience.    Follow-Up: At [facility], you and your health needs are our priority.  As part of our continuing mission to provide you with exceptional heart care, we have created designated Provider Care Teams.  These Care Teams include your primary Cardiologist (physician) and Advanced Practice Providers  (APPs -  Physician Assistants and Nurse Practitioners) who all work together to provide you with the care you need, when you need it.  We recommend signing up for the patient portal called "MyChart".  Sign up information is provided on this After Visit Summary.  MyChart is used to connect with patients for Virtual Visits (Telemedicine).  Patients are able to view lab/test results, encounter notes, upcoming appointments, etc.  Non-urgent messages can be sent to your provider as well.   To learn more about what you can do with MyChart, go to [email or web address].    Your next appointment:   12 month(s)  Provider:   Rollene Rotunda, MD  In [place] office  please.

## 2024-02-11 ENCOUNTER — Ambulatory Visit (HOSPITAL_COMMUNITY): Payer: Medicare Other | Attending: Cardiology

## 2024-02-11 DIAGNOSIS — R011 Cardiac murmur, unspecified: Secondary | ICD-10-CM | POA: Insufficient documentation

## 2024-02-11 LAB — ECHOCARDIOGRAM COMPLETE
AR max vel: 2.06 cm2
AV Area VTI: 2.08 cm2
AV Area mean vel: 2.07 cm2
AV Mean grad: 8.5 mm[Hg]
AV Peak grad: 16.8 mm[Hg]
Ao pk vel: 2.05 m/s
Area-P 1/2: 2.5 cm2
S' Lateral: 2.6 cm

## 2024-03-02 ENCOUNTER — Other Ambulatory Visit (HOSPITAL_COMMUNITY): Payer: Self-pay

## 2024-03-02 DIAGNOSIS — I700 Atherosclerosis of aorta: Secondary | ICD-10-CM

## 2024-03-09 ENCOUNTER — Ambulatory Visit
Admission: RE | Admit: 2024-03-09 | Discharge: 2024-03-09 | Disposition: A | Discharge status: Home / Self Care | Source: Ambulatory Visit | Attending: Cardiology | Admitting: Cardiology

## 2024-03-09 DIAGNOSIS — I700 Atherosclerosis of aorta: Secondary | ICD-10-CM

## 2024-03-09 MED ORDER — IOPAMIDOL (ISOVUE-370) INJECTION 76%
75.0000 mL | Freq: Once | INTRAVENOUS | Status: AC | PRN
  Administered 2024-03-09: 75 mL via INTRAVENOUS

## 2024-03-15 ENCOUNTER — Encounter: Payer: Self-pay | Admitting: *Deleted

## 2024-03-15 ENCOUNTER — Other Ambulatory Visit: Payer: Self-pay | Admitting: *Deleted

## 2024-03-15 DIAGNOSIS — I7121 Aneurysm of the ascending aorta, without rupture: Secondary | ICD-10-CM

## 2024-04-14 ENCOUNTER — Encounter: Payer: Self-pay | Admitting: Cardiology

## 2024-04-15 ENCOUNTER — Other Ambulatory Visit

## 2024-04-15 DIAGNOSIS — E785 Hyperlipidemia, unspecified: Secondary | ICD-10-CM | POA: Diagnosis not present

## 2024-04-16 LAB — LIPID PANEL
Chol/HDL Ratio: 2.9 ratio (ref 0.0–5.0)
Cholesterol, Total: 110 mg/dL (ref 100–199)
HDL: 38 mg/dL — ABNORMAL LOW (ref >39)
LDL Chol Calc (NIH): 60 mg/dL (ref 0–99)
Triglycerides: 52 mg/dL (ref 0–149)
VLDL Cholesterol Cal: 12 mg/dL (ref 5–40)

## 2024-04-19 ENCOUNTER — Institutional Professional Consult (permissible substitution) (INDEPENDENT_AMBULATORY_CARE_PROVIDER_SITE_OTHER): Admitting: Surgery

## 2024-04-19 ENCOUNTER — Encounter: Payer: Self-pay | Admitting: Surgery

## 2024-04-19 ENCOUNTER — Encounter: Payer: Self-pay | Admitting: *Deleted

## 2024-04-19 ENCOUNTER — Ambulatory Visit: Payer: Medicare Other

## 2024-04-19 VITALS — BP 127/65 | HR 56 | Resp 20 | Ht 75.0 in | Wt 165.0 lb

## 2024-04-19 DIAGNOSIS — I77810 Thoracic aortic ectasia: Secondary | ICD-10-CM

## 2024-04-19 DIAGNOSIS — E785 Hyperlipidemia, unspecified: Secondary | ICD-10-CM

## 2024-04-19 NOTE — Progress Notes (Signed)
 Cardiothoracic Surgery Consultation  PCP is Milian, Winda Hastings, FNP Referring Provider is Eilleen Grates, MD  Chief Complaint  Patient presents with   Thoracic Aortic Aneurysm    CTA Chest 03/09/24/ ECHO 02/11/24    HPI:  The patient is a 78 year old gentleman with history of heavy smoking and COPD.  Recently quit, peripheral vascular disease with left lower extremity claudication, and ascending aortic aneurysm that was noted to be 4.6 cm in diameter on CTA of the chest in June 2020.  Since it was still well below the surgical threshold of 5.5 cm we decided to continue following it yearly.  I did a telephone consultation with him in July 2021 due to COVID and the aneurysm is unchanged at 4.6 cm.  He was supposed to return to see me in 1 year with a CT of the chest for follow-up but did not have a repeat CT until March 09, 2024.  The ascending aortic aneurysm was measured at 5.1 x 4.9 cm in the midportion.  The ascending aorta measured 4.1 x 4.3 cm distally with the proximal aortic arch measuring 3.4 cm.  The distal descending aorta measured 3.2 x 3.3 cm.  There were extensive coronary artery calcifications.  He is here today with his wife.  He has been feeling fine and denies any chest pain or pressure.  He said no shortness of breath.  He quit smoking a few months ago.  He remains active working around his house.  Past Medical History:  Diagnosis Date   Blood pressure elevated 01/20/2014   PT'S B/P ELEVATED TODAY   - PT STATES HIS B/P OK WHEN HE CHECKS AT HOME - HE IS ANXIOUS ABOUT HAVING SURGERY.    Past Surgical History:  Procedure Laterality Date   INGUINAL HERNIA REPAIR Left 01/21/2014   Procedure: OPEN LEFT INGUNIAL HERNIA REPAIR;  Surgeon: Thayne Fine, MD;  Location: [facility];  Service: General;  Laterality: Left;  with Mesh   INGUINAL HERNIA REPAIR Left 02/15/2015   Procedure: LAPAROSCOPIC LEFT  INGUINAL HERNIA REPAIR;  Surgeon: Juanita Norlander, MD;  Location: [facility];   Service: General;  Laterality: Left;   INSERTION OF MESH Left 02/15/2015   Procedure: INSERTION OF MESH;  Surgeon: Juanita Norlander, MD;  Location: [facility];  Service: General;  Laterality: Left;   KNEE ARTHROSCOPY     TONSILLECTOMY     AS A CHILD    Family History  Problem Relation Age of Onset   Diabetes Mother    Stroke Mother    Diabetes Sister     Social History Social History   Tobacco Use   Smoking status: Former    Current packs/day: 0.00    Average packs/day: 0.8 packs/day for 50.0 years (37.5 ttl pk-yrs)    Types: Cigarettes    Quit date: 11/2023    Years since quitting: 0.3   Smokeless tobacco: Never  Vaping Use   Vaping status: Never Used  Substance Use Topics   Alcohol use: Not Currently    Comment: Has not had a drink in 3 years   Drug use: Yes    Types: Marijuana    Current Outpatient Medications  Medication Sig Dispense Refill   ammonium lactate  (LAC-HYDRIN ) 12 % lotion Apply 1 Application topically in the morning and at bedtime. 450 each 0   aspirin  EC 81 MG tablet Take 1 tablet (81 mg total) by mouth daily. Swallow whole.     pentoxifylline  (TRENTAL ) 400 MG CR tablet Take 1  tablet (400 mg total) by mouth 3 (three) times daily with meals. 90 tablet 1   pravastatin  (PRAVACHOL ) 40 MG tablet Take 1 tablet (40 mg total) by mouth every evening. 90 tablet 3   No current facility-administered medications for this visit.    No Known Allergies  Review of Systems  Constitutional:  Negative for activity change and fatigue.  HENT: Negative.    Eyes: Negative.   Respiratory:  Negative for shortness of breath.   Cardiovascular:  Negative for chest pain, palpitations and leg swelling.  Gastrointestinal: Negative.   Endocrine: Negative.   Genitourinary: Negative.   Musculoskeletal: Negative.   Skin: Negative.   Allergic/Immunologic: Negative.   Neurological:  Negative for dizziness and syncope.  Hematological: Negative.   Psychiatric/Behavioral: Negative.       BP 127/65   Pulse (!) 56   Resp 20   Ht 6\' 3"  (1.905 m)   Wt 165 lb (74.8 kg)   SpO2 96% Comment: RA  BMI 20.62 kg/m  Physical Exam Constitutional:      Appearance: Normal appearance. He is normal weight.  HENT:     Head: Normocephalic and atraumatic.  Eyes:     Extraocular Movements: Extraocular movements intact.     Conjunctiva/sclera: Conjunctivae normal.     Pupils: Pupils are equal, round, and reactive to light.  Cardiovascular:     Rate and Rhythm: Normal rate and regular rhythm.     Heart sounds: Murmur heard.     Comments: 2/6 systolic murmur RLSB Pulmonary:     Effort: Pulmonary effort is normal.     Breath sounds: Normal breath sounds.  Abdominal:     General: There is no distension.     Tenderness: There is no abdominal tenderness.  Musculoskeletal:        General: No swelling.  Skin:    General: Skin is warm and dry.  Neurological:     General: No focal deficit present.     Mental Status: He is alert and oriented to person, place, and time.  Psychiatric:        Mood and Affect: Mood normal.        Behavior: Behavior normal.      Diagnostic Tests:  Narrative & Impression  CLINICAL DATA:  Enlarged aorta   EXAM: CT ANGIOGRAPHY CHEST WITH CONTRAST   TECHNIQUE: Multidetector CT imaging of the chest was performed using the standard protocol during bolus administration of intravenous contrast. Multiplanar CT image reconstructions and MIPs were obtained to evaluate the vascular anatomy.   RADIATION DOSE REDUCTION: This exam was performed according to the departmental dose-optimization program which includes automated exposure control, adjustment of the mA and/or kV according to patient size and/or use of iterative reconstruction technique.   CONTRAST:  75mL ISOVUE -370 IOPAMIDOL  (ISOVUE -370) INJECTION 76%   COMPARISON:  CT chest July 12, 2020   FINDINGS: Cardiovascular: The ascending aorta measures 5.1 x 4.9 cm in the transverse and AP  diameter 4 cm above the sino-tubular junction.   The ascending aorta measures distally 4.1 x 4.3 cm. The aortic arch demonstrates arteriosclerosis without evidence of significant dilatation. Proximal portion of the aortic arch measures 3.4 cm transverse diameter the distal portion of the arch the proximal descending thoracic aorta measures 3.2 by 3.2 cm and the distal descending thoracic aorta measures 3.2 x 3.3 cm   Visualized portions of the abdominal aorta unremarkable. No dissection.   Extensive coronary artery calcifications.   No pericardial effusions   Mediastinum/Nodes: No enlarged mediastinal,  hilar, or axillary lymph nodes. Thyroid gland, trachea, and esophagus demonstrate no significant findings.   Lungs/Pleura: Subtle centrilobular emphysema. No acute infiltrates or consolidations.   Stable 5 mm nodule in the anterior segment of the left upper lobe image 83. The previously described 4 mm nodule of the right upper lobe not clearly identified on these images.   Upper Abdomen: Stable 12 mm peripancreatic lymph node compared with prior examination.   Musculoskeletal: No chest wall abnormality. No acute or significant osseous findings.   Review of the MIP images confirms the above findings.   IMPRESSION: *5.1 cm in maximum diameter aneurysms of the ascending aorta. It appears to be slightly increased since prior examination when it was measured at 4.6 cm. *Stable 5 mm nodule in the anterior segment of the left upper lobe. *Stable 12 mm peripancreatic lymph node. *Subtle centrilobular emphysema. *Extensive coronary artery calcifications.     Electronically Signed   By: Fredrich Jefferson M.D.   On: 03/09/2024 15:35      Impression:  This 78 year old gentleman has a 5.1 cm fusiform ascending aortic aneurysm that has increased in size from 4.4 cm when it was first diagnosed in 2019.  It was 4.6 cm when last examined on CTA in July 2021.  His most recent echo in  February 2025 showed a severely calcified and thickened trileaflet aortic valve with a mean gradient of 8.5 mmHg and a valve area by VTI of 2.08 cm indicating no significant aortic stenosis.  Given the degree of calcification this may progress over the next couple years.  His aneurysm is still below the surgical threshold of 5.5 cm but has increased 7 mm since 2019 which is a fairly typical rate of progression for enlarging aneurysms.  His blood pressure is under good control.  I have recommended that we do a repeat CT in 6 months and he will require a follow-up echocardiogram yearly.  I reviewed the CT images with him and answered all of his questions.  I stressed the importance of continued good blood pressure control in preventing further enlargement and acute aortic dissection.  I advised him against doing any heavy lifting that may require a Valsalva maneuver and could suddenly raise his blood pressure to high levels.   Plan:  He will return to see me in 6 months with a CTA of the chest.  He will continue to follow-up with cardiology and should have a follow-up echocardiogram in February 2026.  I spent 45 minutes performing this consultation and > 50% of this time was spent face to face counseling and coordinating the care of this patient's ascending aortic aneurysm.  Bartley Lightning, MD [facility] [phone]

## 2024-04-20 MED ORDER — PRAVASTATIN SODIUM 80 MG PO TABS: 80.0000 mg | ORAL_TABLET | Freq: Every evening | ORAL | 3 refills | Status: AC

## 2024-07-27 ENCOUNTER — Telehealth: Payer: Self-pay | Admitting: Family Medicine

## 2024-07-27 ENCOUNTER — Other Ambulatory Visit

## 2024-07-27 ENCOUNTER — Encounter: Payer: Self-pay | Admitting: Cardiology

## 2024-07-27 DIAGNOSIS — E785 Hyperlipidemia, unspecified: Secondary | ICD-10-CM

## 2024-07-27 NOTE — Telephone Encounter (Signed)
 I advised pt to make an appt w/New Provider taking on GM's pt. He was last seen Nov 17, 2024. I told him that in order to get labs done here that he needs to see a provider once a year in order to get someone to sign off on his labs.

## 2024-07-28 ENCOUNTER — Other Ambulatory Visit

## 2024-08-05 ENCOUNTER — Other Ambulatory Visit

## 2024-08-05 DIAGNOSIS — E785 Hyperlipidemia, unspecified: Secondary | ICD-10-CM | POA: Diagnosis not present

## 2024-08-05 LAB — LIPID PANEL

## 2024-08-06 LAB — LIPID PANEL
Cholesterol, Total: 110 mg/dL (ref 100–199)
HDL: 41 mg/dL (ref >39)
LDL CALC COMMENT:: 2.7 ratio (ref 0.0–5.0)
LDL Chol Calc (NIH): 55 mg/dL (ref 0–99)
Triglycerides: 64 mg/dL (ref 0–149)
VLDL Cholesterol Cal: 14 mg/dL (ref 5–40)

## 2024-08-09 ENCOUNTER — Ambulatory Visit: Payer: Self-pay | Admitting: Cardiology

## 2024-09-30 DIAGNOSIS — Z23 Encounter for immunization: Secondary | ICD-10-CM | POA: Diagnosis not present

## 2024-11-01 ENCOUNTER — Encounter: Payer: Self-pay | Admitting: Radiology

## 2025-01-31 ENCOUNTER — Other Ambulatory Visit: Payer: Self-pay | Admitting: *Deleted

## 2025-01-31 DIAGNOSIS — I359 Nonrheumatic aortic valve disorder, unspecified: Secondary | ICD-10-CM

## 2025-03-11 ENCOUNTER — Ambulatory Visit (HOSPITAL_COMMUNITY)

## 2025-03-16 ENCOUNTER — Ambulatory Visit: Admitting: Cardiology
